# Patient Record
Sex: Male | Born: 1985
Health system: Southern US, Community
[De-identification: ages and names within clinical notes are randomized; demographics above are authoritative.]

## PROBLEM LIST (undated history)

## (undated) DIAGNOSIS — Y249XXA Unspecified firearm discharge, undetermined intent, initial encounter: Secondary | ICD-10-CM

## (undated) DIAGNOSIS — W3400XA Accidental discharge from unspecified firearms or gun, initial encounter: Secondary | ICD-10-CM

## (undated) HISTORY — DX: Accidental discharge from unspecified firearms or gun, initial encounter: W34.00XA

## (undated) HISTORY — PX: OMENTECTOMY: SHX2098

## (undated) HISTORY — PX: GASTRORRHAPHY: SHX6263

## (undated) HISTORY — PX: HEPATORRHAPHY: SHX6320

## (undated) HISTORY — PX: SMALL BOWEL REPAIR: SHX6447

## (undated) HISTORY — PX: COLON SURGERY: SHX602

## (undated) HISTORY — PX: BLADDER REPAIR: SHX76

## (undated) HISTORY — DX: Unspecified firearm discharge, undetermined intent, initial encounter: Y24.9XXA

## (undated) HISTORY — PX: RESECTION SMALL BOWEL / CLOSURE ILEOSTOMY: SUR1248

---

## 2017-02-07 ENCOUNTER — Emergency Department (HOSPITAL_COMMUNITY)
Admission: EM | Admit: 2017-02-07 | Discharge: 2017-02-07 | Disposition: A | Discharge status: Home / Self Care | Payer: Self-pay | Attending: Emergency Medicine | Admitting: Emergency Medicine

## 2017-02-07 ENCOUNTER — Encounter (HOSPITAL_COMMUNITY): Payer: Self-pay | Admitting: Emergency Medicine

## 2017-02-07 DIAGNOSIS — K0889 Other specified disorders of teeth and supporting structures: Secondary | ICD-10-CM | POA: Insufficient documentation

## 2017-02-07 DIAGNOSIS — F17200 Nicotine dependence, unspecified, uncomplicated: Secondary | ICD-10-CM | POA: Insufficient documentation

## 2017-02-07 MED ORDER — PENICILLIN V POTASSIUM 500 MG PO TABS: 500.0000 mg | ORAL_TABLET | Freq: Four times a day (QID) | ORAL | 0 refills | Status: AC

## 2017-02-07 MED ORDER — IBUPROFEN 200 MG PO TABS
600.0000 mg | ORAL_TABLET | Freq: Once | ORAL | Status: AC
  Administered 2017-02-07: 600 mg via ORAL
  Filled 2017-02-07: qty 3

## 2017-02-07 MED ORDER — IBUPROFEN 600 MG PO TABS: 600.0000 mg | ORAL_TABLET | Freq: Four times a day (QID) | ORAL | 0 refills | Status: DC | PRN

## 2017-02-07 MED ORDER — HYDROCODONE-ACETAMINOPHEN 5-325 MG PO TABS: 2.0000 | ORAL_TABLET | ORAL | 0 refills | Status: DC | PRN

## 2017-02-07 MED ORDER — OXYCODONE-ACETAMINOPHEN 5-325 MG PO TABS
1.0000 | ORAL_TABLET | Freq: Once | ORAL | Status: AC
  Administered 2017-02-07: 1 via ORAL
  Filled 2017-02-07: qty 1

## 2017-02-07 NOTE — ED Notes (Signed)
Pt reports R lower dental pain x 1 week.  Broken tooth noted.

## 2017-02-07 NOTE — ED Provider Notes (Signed)
[facility]-EMERGENCY DEPT Provider Note   CSN: [id number] Arrival date & time: 02/07/17  1835  By signing my name below, I, Rosario Adie, attest that this documentation has been prepared under the direction and in the presence of Raeford Razor, MD. Electronically Signed: Rosario Adie, ED Scribe. 02/07/17. 7:47 PM.  History   Chief Complaint Chief Complaint  Patient presents with  . Dental Pain   The history is provided by the patient. No language interpreter was used.    HPI Comments: John Briggs is a 31 y.o. male with no pertinent PMHx, who presents to the Emergency Department complaining of persistent, gradually worsening, right-sided, lower dental pain beginning approximately one week ago. Pt describes their pain as throbbing. Per pt, a tooth to the area chipped several months ago and had not given him issues until one week ago. He reports associated right ear pain and right-sided cervical lymphadenopathy. Pt has been taking Ibuprogen at home with minimal relief of his pain. Pt's pain is exacerbated with chewing. They are not currently followed by a dental specialist. Pt denies fever, chills, difficulty swallowing, or any other associated symptoms.   History reviewed. No pertinent past medical history.  There are no active problems to display for this patient.  History reviewed. No pertinent surgical history.  Home Medications    Prior to Admission medications   Not on File   Family History Family History  Problem Relation Age of Onset  . Cancer Other    Social History Social History  Substance Use Topics  . Smoking status: Current Every Day Smoker  . Smokeless tobacco: Never Used  . Alcohol use Yes     Comment: weekends   Allergies   Patient has no known allergies.  Review of Systems Review of Systems  Constitutional: Negative for chills and fever.  HENT: Positive for dental problem and ear pain. Negative for trouble swallowing.   Hematological:  Positive for adenopathy.  All other systems reviewed and are negative.  Physical Exam Updated Vital Signs BP (!) 145/93 (BP Location: Left Arm)   Pulse 85   Temp 98.5 F (36.9 C) (Oral)   Resp 18   SpO2 99%   Physical Exam  Constitutional: He appears well-developed and well-nourished. No distress.  HENT:  Head: Normocephalic and atraumatic.  Right lower premolar cracked. Some mild gingival swelling, but no abscess. No trismus. Handling secretions. Mild right facial swelling. Submental tissues soft.   Eyes: Conjunctivae are normal.  Neck: Normal range of motion. Neck supple.  Neck supple. Right cervical adenopathy.   Cardiovascular: Normal rate.   Pulmonary/Chest: Effort normal.  Abdominal: He exhibits no distension. Mass:  .scrib.  Musculoskeletal: Normal range of motion.  Lymphadenopathy:    He has cervical adenopathy.  Neurological: He is alert.  Skin: No pallor.  Psychiatric: He has a normal mood and affect. His behavior is normal.  Nursing note and vitals reviewed.  ED Treatments / Results  DIAGNOSTIC STUDIES: Oxygen Saturation is 99% on RA, normal by my interpretation.   COORDINATION OF CARE: 7:45 PM-Discussed next steps with pt. Pt verbalized understanding and is agreeable with the plan.   Labs (all labs ordered are listed, but only abnormal results are displayed) Labs Reviewed - No data to display  EKG  EKG Interpretation None      Radiology No results found.  Procedures Procedures   Medications Ordered in ED Medications - No data to display  Initial Impression / Assessment and Plan / ED Course  I  have reviewed the triage vital signs and the nursing notes.  Pertinent labs & imaging results that were available during my care of the patient were reviewed by me and considered in my medical decision making (see chart for details).     31 year old male with dental/facial pain. Some mild right-sided facial swelling on exam. No significant concern  for airway compromise. No drainable collection. Will place on antibiotics. When necessary pain medication. Needs dental follow-up.  Final Clinical Impressions(s) / ED Diagnoses   Final diagnoses:  Toothache   New Prescriptions New Prescriptions   No medications on file   I personally preformed the services scribed in my presence. The recorded information has been reviewed is accurate. Raeford RazorStephen Bradely Rudin, MD.     Raeford RazorKohut, Kathie Posa, MD 02/07/17 [phone]

## 2017-02-07 NOTE — ED Triage Notes (Signed)
Pt is c/o toothache on the right side  Pt has facial swelling noted  Sxs started a week ago

## 2017-02-07 NOTE — ED Notes (Signed)
Pt reports he called for a ride.  Verbalizes understanding of not driving after pain med administration

## 2019-01-23 ENCOUNTER — Emergency Department (HOSPITAL_COMMUNITY): Payer: No Typology Code available for payment source

## 2019-01-23 ENCOUNTER — Encounter (HOSPITAL_COMMUNITY): Admission: EM | Disposition: A | Discharge status: Home / Self Care | Payer: Self-pay | Source: Home / Self Care

## 2019-01-23 ENCOUNTER — Encounter (HOSPITAL_COMMUNITY): Payer: Self-pay | Admitting: Emergency Medicine

## 2019-01-23 ENCOUNTER — Emergency Department (HOSPITAL_COMMUNITY): Payer: No Typology Code available for payment source | Admitting: Anesthesiology

## 2019-01-23 ENCOUNTER — Other Ambulatory Visit: Payer: Self-pay

## 2019-01-23 ENCOUNTER — Inpatient Hospital Stay (HOSPITAL_COMMUNITY)
Admission: EM | Admit: 2019-01-23 | Discharge: 2019-02-11 | DRG: 957 | Disposition: A | Discharge status: Home / Self Care | Payer: No Typology Code available for payment source | Attending: General Surgery | Admitting: General Surgery

## 2019-01-23 DIAGNOSIS — S3720XD Unspecified injury of bladder, subsequent encounter: Secondary | ICD-10-CM

## 2019-01-23 DIAGNOSIS — R0602 Shortness of breath: Secondary | ICD-10-CM

## 2019-01-23 DIAGNOSIS — T148XXA Other injury of unspecified body region, initial encounter: Secondary | ICD-10-CM | POA: Diagnosis not present

## 2019-01-23 DIAGNOSIS — L0291 Cutaneous abscess, unspecified: Secondary | ICD-10-CM

## 2019-01-23 DIAGNOSIS — S31639A Puncture wound without foreign body of abdominal wall, unspecified quadrant with penetration into peritoneal cavity, initial encounter: Secondary | ICD-10-CM | POA: Diagnosis present

## 2019-01-23 DIAGNOSIS — S3681XA Injury of peritoneum, initial encounter: Secondary | ICD-10-CM | POA: Diagnosis present

## 2019-01-23 DIAGNOSIS — S36118A Other injury of liver, initial encounter: Secondary | ICD-10-CM | POA: Diagnosis not present

## 2019-01-23 DIAGNOSIS — S3639XA Other injury of stomach, initial encounter: Secondary | ICD-10-CM | POA: Diagnosis present

## 2019-01-23 DIAGNOSIS — S3729XA Other injury of bladder, initial encounter: Secondary | ICD-10-CM | POA: Diagnosis present

## 2019-01-23 DIAGNOSIS — K651 Peritoneal abscess: Secondary | ICD-10-CM

## 2019-01-23 DIAGNOSIS — R402362 Coma scale, best motor response, obeys commands, at arrival to emergency department: Secondary | ICD-10-CM | POA: Diagnosis present

## 2019-01-23 DIAGNOSIS — W3400XA Accidental discharge from unspecified firearms or gun, initial encounter: Secondary | ICD-10-CM

## 2019-01-23 DIAGNOSIS — T07XXXA Unspecified multiple injuries, initial encounter: Secondary | ICD-10-CM

## 2019-01-23 DIAGNOSIS — S31632A Puncture wound without foreign body of abdominal wall, epigastric region with penetration into peritoneal cavity, initial encounter: Secondary | ICD-10-CM | POA: Diagnosis present

## 2019-01-23 DIAGNOSIS — R571 Hypovolemic shock: Secondary | ICD-10-CM

## 2019-01-23 DIAGNOSIS — Z978 Presence of other specified devices: Secondary | ICD-10-CM

## 2019-01-23 DIAGNOSIS — R578 Other shock: Secondary | ICD-10-CM | POA: Diagnosis present

## 2019-01-23 DIAGNOSIS — R188 Other ascites: Secondary | ICD-10-CM

## 2019-01-23 DIAGNOSIS — Z20828 Contact with and (suspected) exposure to other viral communicable diseases: Secondary | ICD-10-CM | POA: Diagnosis present

## 2019-01-23 DIAGNOSIS — S36498A Other injury of other part of small intestine, initial encounter: Secondary | ICD-10-CM | POA: Diagnosis present

## 2019-01-23 DIAGNOSIS — R402252 Coma scale, best verbal response, oriented, at arrival to emergency department: Secondary | ICD-10-CM | POA: Diagnosis present

## 2019-01-23 DIAGNOSIS — S3720XA Unspecified injury of bladder, initial encounter: Secondary | ICD-10-CM

## 2019-01-23 DIAGNOSIS — D62 Acute posthemorrhagic anemia: Secondary | ICD-10-CM | POA: Diagnosis present

## 2019-01-23 DIAGNOSIS — R402142 Coma scale, eyes open, spontaneous, at arrival to emergency department: Secondary | ICD-10-CM | POA: Diagnosis present

## 2019-01-23 DIAGNOSIS — E876 Hypokalemia: Secondary | ICD-10-CM | POA: Diagnosis not present

## 2019-01-23 HISTORY — PX: LAPAROTOMY: SHX154

## 2019-01-23 LAB — PROTIME-INR
INR: 1.2 (ref 0.8–1.2)
Prothrombin Time: 15 seconds (ref 11.4–15.2)

## 2019-01-23 LAB — LACTIC ACID, PLASMA: Lactic Acid, Venous: >11 mmol/L (ref 0.5–1.9)

## 2019-01-23 LAB — CBC
HCT: 33.4 % — ABNORMAL LOW (ref 39.0–52.0)
Hemoglobin: 10.8 g/dL — ABNORMAL LOW (ref 13.0–17.0)
MCH: 34.3 pg — ABNORMAL HIGH (ref 26.0–34.0)
MCHC: 32.3 g/dL (ref 30.0–36.0)
MCV: 106 fL — ABNORMAL HIGH (ref 80.0–100.0)
Platelets: 250 10*3/uL (ref 150–400)
RBC: 3.15 MIL/uL — ABNORMAL LOW (ref 4.22–5.81)
RDW: 13.2 % (ref 11.5–15.5)
WBC: 9.5 10*3/uL (ref 4.0–10.5)
nRBC: 0.4 % — ABNORMAL HIGH (ref 0.0–0.2)

## 2019-01-23 LAB — ABO/RH: ABO/RH(D): O NEG

## 2019-01-23 LAB — ETHANOL: Alcohol, Ethyl (B): 326 mg/dL (ref <10)

## 2019-01-23 SURGERY — LAPAROTOMY, EXPLORATORY
Anesthesia: General

## 2019-01-23 MED ORDER — SODIUM CHLORIDE 0.9% IV SOLUTION: Freq: Once | INTRAVENOUS | Status: DC

## 2019-01-23 MED ORDER — MIDAZOLAM HCL 5 MG/5ML IJ SOLN
INTRAMUSCULAR | Status: DC | PRN
  Administered 2019-01-23: 2 mg via INTRAVENOUS

## 2019-01-23 MED ORDER — CALCIUM CHLORIDE 10 % IV SOLN
INTRAVENOUS | Status: DC | PRN
  Administered 2019-01-23: 700 mg via INTRAVENOUS
  Administered 2019-01-23: 300 mg via INTRAVENOUS

## 2019-01-23 MED ORDER — TRANEXAMIC ACID-NACL 1000-0.7 MG/100ML-% IV SOLN
1000.0000 mg | Freq: Once | INTRAVENOUS | Status: AC
  Administered 2019-01-23: 23:00:00 1000 mg via INTRAVENOUS
  Filled 2019-01-23: qty 100

## 2019-01-23 MED ORDER — FENTANYL CITRATE (PF) 250 MCG/5ML IJ SOLN
INTRAMUSCULAR | Status: AC
  Filled 2019-01-23: qty 5

## 2019-01-23 MED ORDER — SODIUM BICARBONATE 8.4 % IV SOLN
INTRAVENOUS | Status: DC | PRN
  Administered 2019-01-23 (×2): 50 meq via INTRAVENOUS

## 2019-01-23 MED ORDER — 0.9 % SODIUM CHLORIDE (POUR BTL) OPTIME
TOPICAL | Status: DC | PRN
  Administered 2019-01-23 – 2019-01-24 (×3): 1000 mL

## 2019-01-23 MED ORDER — SODIUM CHLORIDE 0.9 % IV SOLN
INTRAVENOUS | Status: AC | PRN
  Administered 2019-01-23: 1000 mL via INTRAVENOUS

## 2019-01-23 MED ORDER — PHENYLEPHRINE HCL (PRESSORS) 10 MG/ML IV SOLN
INTRAVENOUS | Status: DC | PRN
  Administered 2019-01-23: 80 ug via INTRAVENOUS

## 2019-01-23 MED ORDER — MIDAZOLAM HCL 2 MG/2ML IJ SOLN
INTRAMUSCULAR | Status: AC
  Filled 2019-01-23: qty 2

## 2019-01-23 MED ORDER — CALCIUM CHLORIDE 10 % IV SOLN
INTRAVENOUS | Status: AC
  Filled 2019-01-23: qty 20

## 2019-01-23 MED ORDER — LACTATED RINGERS IV SOLN
INTRAVENOUS | Status: DC | PRN
  Administered 2019-01-23: 23:00:00 via INTRAVENOUS

## 2019-01-23 MED ORDER — TRANEXAMIC ACID 1000 MG/10ML IV SOLN
1000.0000 mg | Freq: Once | INTRAVENOUS | Status: AC
  Administered 2019-01-23: 1000 mg via INTRAVENOUS
  Filled 2019-01-23: qty 10

## 2019-01-23 MED ORDER — SODIUM CHLORIDE 0.9 % IV SOLN
INTRAVENOUS | Status: DC | PRN
  Administered 2019-01-23 – 2019-01-24 (×2): via INTRAVENOUS

## 2019-01-23 MED ORDER — CEFAZOLIN SODIUM-DEXTROSE 2-3 GM-%(50ML) IV SOLR
INTRAVENOUS | Status: DC | PRN
  Administered 2019-01-23: 2 g via INTRAVENOUS

## 2019-01-23 SURGICAL SUPPLY — 62 items
BLADE CLIPPER SURG (BLADE) IMPLANT
CANISTER SUCT 3000ML PPV (MISCELLANEOUS) ×3 IMPLANT
CANISTER WOUNDNEG PRESSURE 500 (CANNISTER) ×3 IMPLANT
CATH FOLEY 3WAY 30CC 22FR (CATHETERS) ×3 IMPLANT
CHLORAPREP W/TINT 26ML (MISCELLANEOUS) ×3 IMPLANT
COVER SURGICAL LIGHT HANDLE (MISCELLANEOUS) ×3 IMPLANT
COVER WAND RF STERILE (DRAPES) ×3 IMPLANT
DRAPE LAPAROSCOPIC ABDOMINAL (DRAPES) ×3 IMPLANT
DRAPE WARM FLUID 44X44 (DRAPE) ×3 IMPLANT
DRSG OPSITE POSTOP 4X10 (GAUZE/BANDAGES/DRESSINGS) IMPLANT
DRSG OPSITE POSTOP 4X8 (GAUZE/BANDAGES/DRESSINGS) IMPLANT
ELECT BLADE 6.5 EXT (BLADE) IMPLANT
ELECT CAUTERY BLADE 6.4 (BLADE) ×3 IMPLANT
ELECT REM PT RETURN 9FT ADLT (ELECTROSURGICAL) ×3
ELECTRODE REM PT RTRN 9FT ADLT (ELECTROSURGICAL) ×1 IMPLANT
GAUZE SPONGE 4X4 12PLY STRL (GAUZE/BANDAGES/DRESSINGS) ×3 IMPLANT
GLOVE BIO SURGEON STRL SZ8 (GLOVE) ×3 IMPLANT
GLOVE BIOGEL PI IND STRL 8 (GLOVE) ×1 IMPLANT
GLOVE BIOGEL PI INDICATOR 8 (GLOVE) ×2
GOWN STRL REUS W/ TWL LRG LVL3 (GOWN DISPOSABLE) ×1 IMPLANT
GOWN STRL REUS W/ TWL XL LVL3 (GOWN DISPOSABLE) ×1 IMPLANT
GOWN STRL REUS W/TWL LRG LVL3 (GOWN DISPOSABLE) ×2
GOWN STRL REUS W/TWL XL LVL3 (GOWN DISPOSABLE) ×2
GUIDEWIRE ANG ZIPWIRE 038X150 (WIRE) ×3 IMPLANT
GUIDEWIRE STR DUAL SENSOR (WIRE) ×3 IMPLANT
HEMOSTAT SNOW SURGICEL 2X4 (HEMOSTASIS) ×2 IMPLANT
KIT BASIN OR (CUSTOM PROCEDURE TRAY) ×3 IMPLANT
KIT TURNOVER KIT B (KITS) ×3 IMPLANT
LIGASURE IMPACT 36 18CM CVD LR (INSTRUMENTS) ×6 IMPLANT
NS IRRIG 1000ML POUR BTL (IV SOLUTION) ×6 IMPLANT
PACK GENERAL/GYN (CUSTOM PROCEDURE TRAY) ×3 IMPLANT
PAD ARMBOARD 7.5X6 YLW CONV (MISCELLANEOUS) ×3 IMPLANT
PENCIL SMOKE EVACUATOR (MISCELLANEOUS) ×3 IMPLANT
PLUG CATH AND CAP STER (CATHETERS) ×6 IMPLANT
RELOAD LINEAR CUT PROX 55 BLUE (ENDOMECHANICALS) ×6 IMPLANT
RELOAD PROXIMATE 75MM BLUE (ENDOMECHANICALS) ×21 IMPLANT
RELOAD PROXIMATE TA60MM BLUE (ENDOMECHANICALS) ×9 IMPLANT
SPECIMEN JAR LARGE (MISCELLANEOUS) IMPLANT
SPONGE ABD ABTHERA ADVANCE (MISCELLANEOUS) ×3 IMPLANT
SPONGE ABDOMINAL VAC ABTHERA (MISCELLANEOUS) ×3 IMPLANT
SPONGE LAP 18X18 RF (DISPOSABLE) ×3 IMPLANT
STAPLER GUN LINEAR PROX 60 (STAPLE) ×3 IMPLANT
STAPLER PROXIMATE 75MM BLUE (STAPLE) ×3 IMPLANT
STAPLER VISISTAT 35W (STAPLE) ×3 IMPLANT
STENT URET 6FRX26 CONTOUR (STENTS) ×3 IMPLANT
SUCTION POOLE TIP (SUCTIONS) ×3 IMPLANT
SURGICEL SNOW 2X4 (HEMOSTASIS) ×3 IMPLANT
SUT PDS AB 1 TP1 96 (SUTURE) ×6 IMPLANT
SUT SILK 2 0 SH CR/8 (SUTURE) ×3 IMPLANT
SUT SILK 2 0 TIES 10X30 (SUTURE) ×6 IMPLANT
SUT SILK 2 0SH CR/8 30 (SUTURE) ×3 IMPLANT
SUT SILK 3 0 SH CR/8 (SUTURE) ×6 IMPLANT
SUT SILK 3 0 TIES 10X30 (SUTURE) ×6 IMPLANT
SUT SILK 3 0SH CR/8 30 (SUTURE) ×3 IMPLANT
SUT VIC AB 0 CT1 27 (SUTURE) ×2
SUT VIC AB 0 CT1 27XBRD ANBCTR (SUTURE) ×1 IMPLANT
SUT VIC AB 2-0 SH 27 (SUTURE) ×2
SUT VIC AB 2-0 SH 27X BRD (SUTURE) ×1 IMPLANT
TOWEL OR 17X26 10 PK STRL BLUE (TOWEL DISPOSABLE) ×3 IMPLANT
TRAY FOLEY MTR SLVR 16FR STAT (SET/KITS/TRAYS/PACK) IMPLANT
TUBE FEEDING ENTERAL 5FR 16IN (TUBING) ×3 IMPLANT
YANKAUER SUCT BULB TIP NO VENT (SUCTIONS) IMPLANT

## 2019-01-23 NOTE — Anesthesia Preprocedure Evaluation (Addendum)
Anesthesia Evaluation  Patient identified by MRN, date of birth, ID band Patient awake    Reviewed: Allergy & Precautions, NPO status , Patient's Chart, lab work & pertinent test resultsPreop documentation limited or incomplete due to emergent nature of procedure.  Airway Mallampati: II  TM Distance: >3 FB     Dental  (+) Dental Advisory Given   Pulmonary neg pulmonary ROS,    breath sounds clear to auscultation       Cardiovascular  Rhythm:Regular Rate:Normal     Neuro/Psych    GI/Hepatic   Endo/Other    Renal/GU      Musculoskeletal   Abdominal   Peds  Hematology   Anesthesia Other Findings   Reproductive/Obstetrics                            Anesthesia Physical Anesthesia Plan  ASA: I and emergent  Anesthesia Plan: General   Post-op Pain Management:    Induction: Intravenous  PONV Risk Score and Plan: 2 and Ondansetron and Dexamethasone  Airway Management Planned: Oral ETT  Additional Equipment:   Intra-op Plan:   Post-operative Plan: Possible Post-op intubation/ventilation  Informed Consent: I have reviewed the patients History and Physical, chart, labs and discussed the procedure including the risks, benefits and alternatives for the proposed anesthesia with the patient or authorized representative who has indicated his/her understanding and acceptance.     Dental advisory given  Plan Discussed with: CRNA, Anesthesiologist and Surgeon  Anesthesia Plan Comments:        Anesthesia Quick Evaluation
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
## 2019-01-24 ENCOUNTER — Encounter (HOSPITAL_COMMUNITY): Payer: Self-pay | Admitting: General Surgery

## 2019-01-24 DIAGNOSIS — S3681XA Injury of peritoneum, initial encounter: Secondary | ICD-10-CM | POA: Diagnosis present

## 2019-01-24 DIAGNOSIS — R578 Other shock: Secondary | ICD-10-CM | POA: Diagnosis present

## 2019-01-24 DIAGNOSIS — S3639XA Other injury of stomach, initial encounter: Secondary | ICD-10-CM | POA: Diagnosis present

## 2019-01-24 DIAGNOSIS — E876 Hypokalemia: Secondary | ICD-10-CM | POA: Diagnosis not present

## 2019-01-24 DIAGNOSIS — S31632A Puncture wound without foreign body of abdominal wall, epigastric region with penetration into peritoneal cavity, initial encounter: Secondary | ICD-10-CM | POA: Diagnosis present

## 2019-01-24 DIAGNOSIS — R402142 Coma scale, eyes open, spontaneous, at arrival to emergency department: Secondary | ICD-10-CM | POA: Diagnosis present

## 2019-01-24 DIAGNOSIS — W3400XA Accidental discharge from unspecified firearms or gun, initial encounter: Secondary | ICD-10-CM | POA: Diagnosis not present

## 2019-01-24 DIAGNOSIS — D62 Acute posthemorrhagic anemia: Secondary | ICD-10-CM | POA: Diagnosis present

## 2019-01-24 DIAGNOSIS — R402362 Coma scale, best motor response, obeys commands, at arrival to emergency department: Secondary | ICD-10-CM | POA: Diagnosis present

## 2019-01-24 DIAGNOSIS — S3729XA Other injury of bladder, initial encounter: Secondary | ICD-10-CM | POA: Diagnosis present

## 2019-01-24 DIAGNOSIS — T148XXA Other injury of unspecified body region, initial encounter: Secondary | ICD-10-CM | POA: Diagnosis present

## 2019-01-24 DIAGNOSIS — S36118A Other injury of liver, initial encounter: Secondary | ICD-10-CM | POA: Diagnosis present

## 2019-01-24 DIAGNOSIS — K651 Peritoneal abscess: Secondary | ICD-10-CM | POA: Diagnosis not present

## 2019-01-24 DIAGNOSIS — S31639A Puncture wound without foreign body of abdominal wall, unspecified quadrant with penetration into peritoneal cavity, initial encounter: Secondary | ICD-10-CM | POA: Diagnosis present

## 2019-01-24 DIAGNOSIS — S36498A Other injury of other part of small intestine, initial encounter: Secondary | ICD-10-CM | POA: Diagnosis present

## 2019-01-24 DIAGNOSIS — R402252 Coma scale, best verbal response, oriented, at arrival to emergency department: Secondary | ICD-10-CM | POA: Diagnosis present

## 2019-01-24 DIAGNOSIS — Z20828 Contact with and (suspected) exposure to other viral communicable diseases: Secondary | ICD-10-CM | POA: Diagnosis present

## 2019-01-24 LAB — PREPARE PLATELET PHERESIS: Unit division: 0

## 2019-01-24 LAB — PREPARE FRESH FROZEN PLASMA
Unit division: 0
Unit division: 0
Unit division: 0
Unit division: 0
Unit division: 0
Unit division: 0
Unit division: 0
Unit division: 0

## 2019-01-24 LAB — POCT I-STAT 7, (LYTES, BLD GAS, ICA,H+H)
Acid-base deficit: 12 mmol/L — ABNORMAL HIGH (ref 0.0–2.0)
Acid-base deficit: 3 mmol/L — ABNORMAL HIGH (ref 0.0–2.0)
Acid-base deficit: 4 mmol/L — ABNORMAL HIGH (ref 0.0–2.0)
Acid-base deficit: 6 mmol/L — ABNORMAL HIGH (ref 0.0–2.0)
Bicarbonate: 14.8 mmol/L — ABNORMAL LOW (ref 20.0–28.0)
Bicarbonate: 20.9 mmol/L (ref 20.0–28.0)
Bicarbonate: 21.3 mmol/L (ref 20.0–28.0)
Bicarbonate: 18.3 mmol/L — ABNORMAL LOW (ref 20.0–28.0)
Calcium, Ion: 0.72 mmol/L — CL (ref 1.15–1.40)
Calcium, Ion: 0.96 mmol/L — ABNORMAL LOW (ref 1.15–1.40)
Calcium, Ion: 1.04 mmol/L — ABNORMAL LOW (ref 1.15–1.40)
Calcium, Ion: 1.07 mmol/L — ABNORMAL LOW (ref 1.15–1.40)
HCT: 25 % — ABNORMAL LOW (ref 39.0–52.0)
HCT: 27 % — ABNORMAL LOW (ref 39.0–52.0)
HCT: 29 % — ABNORMAL LOW (ref 39.0–52.0)
HCT: 37 % — ABNORMAL LOW (ref 39.0–52.0)
Hemoglobin: 8.5 g/dL — ABNORMAL LOW (ref 13.0–17.0)
Hemoglobin: 9.2 g/dL — ABNORMAL LOW (ref 13.0–17.0)
Hemoglobin: 9.9 g/dL — ABNORMAL LOW (ref 13.0–17.0)
Hemoglobin: 12.6 g/dL — ABNORMAL LOW (ref 13.0–17.0)
O2 Saturation: 100 %
O2 Saturation: 100 %
O2 Saturation: 100 %
O2 Saturation: 100 %
Patient temperature: 34
Patient temperature: 34
Patient temperature: 34.8
Patient temperature: 93.6
Potassium: 2.9 mmol/L — ABNORMAL LOW (ref 3.5–5.1)
Potassium: 3.1 mmol/L — ABNORMAL LOW (ref 3.5–5.1)
Potassium: 3.8 mmol/L (ref 3.5–5.1)
Potassium: 2.9 mmol/L — ABNORMAL LOW (ref 3.5–5.1)
Sodium: 143 mmol/L (ref 135–145)
Sodium: 144 mmol/L (ref 135–145)
Sodium: 145 mmol/L (ref 135–145)
Sodium: 144 mmol/L (ref 135–145)
TCO2: 16 mmol/L — ABNORMAL LOW (ref 22–32)
TCO2: 22 mmol/L (ref 22–32)
TCO2: 22 mmol/L (ref 22–32)
TCO2: 19 mmol/L — ABNORMAL LOW (ref 22–32)
pCO2 arterial: 30.5 mmHg — ABNORMAL LOW (ref 32.0–48.0)
pCO2 arterial: 31 mmHg — ABNORMAL LOW (ref 32.0–48.0)
pCO2 arterial: 34.3 mmHg (ref 32.0–48.0)
pCO2 arterial: 29.1 mmHg — ABNORMAL LOW (ref 32.0–48.0)
pH, Arterial: 7.229 — ABNORMAL LOW (ref 7.350–7.450)
pH, Arterial: 7.431 (ref 7.350–7.450)
pH, Arterial: 7.432 (ref 7.350–7.450)
pH, Arterial: 7.394 (ref 7.350–7.450)
pO2, Arterial: 537 mmHg — ABNORMAL HIGH (ref 83.0–108.0)
pO2, Arterial: 550 mmHg — ABNORMAL HIGH (ref 83.0–108.0)
pO2, Arterial: 575 mmHg — ABNORMAL HIGH (ref 83.0–108.0)
pO2, Arterial: 316 mmHg — ABNORMAL HIGH (ref 83.0–108.0)

## 2019-01-24 LAB — BPAM FFP
Blood Product Expiration Date: 202005012359
Blood Product Expiration Date: 202005032359
Blood Product Expiration Date: 202005032359
Blood Product Expiration Date: 202005032359
Blood Product Expiration Date: 202005092359
Blood Product Expiration Date: 202005172359
Blood Product Expiration Date: 202005182359
Blood Product Expiration Date: 202005182359
Blood Product Expiration Date: 202005212359
Blood Product Expiration Date: 202005222359
ISSUE DATE / TIME: 202004302238
ISSUE DATE / TIME: 202004302238
ISSUE DATE / TIME: 202004302316
ISSUE DATE / TIME: 202004302316
ISSUE DATE / TIME: 202004302340
ISSUE DATE / TIME: 202004302350
ISSUE DATE / TIME: 202004302350
ISSUE DATE / TIME: 202004302350
ISSUE DATE / TIME: 202004302350
ISSUE DATE / TIME: 202005010845
Unit Type and Rh: 600
Unit Type and Rh: 6200
Unit Type and Rh: 6200
Unit Type and Rh: 6200
Unit Type and Rh: 6200
Unit Type and Rh: 6200
Unit Type and Rh: 6200
Unit Type and Rh: 6200
Unit Type and Rh: 6200
Unit Type and Rh: 6200

## 2019-01-24 LAB — COMPREHENSIVE METABOLIC PANEL
ALT: 27 U/L (ref 0–44)
AST: 55 U/L — ABNORMAL HIGH (ref 15–41)
Albumin: 3.3 g/dL — ABNORMAL LOW (ref 3.5–5.0)
Alkaline Phosphatase: 54 U/L (ref 38–126)
Anion gap: 22 — ABNORMAL HIGH (ref 5–15)
BUN: 17 mg/dL (ref 6–20)
CO2: 12 mmol/L — ABNORMAL LOW (ref 22–32)
Calcium: 7.9 mg/dL — ABNORMAL LOW (ref 8.9–10.3)
Chloride: 106 mmol/L (ref 98–111)
Creatinine, Ser: 1.4 mg/dL — ABNORMAL HIGH (ref 0.61–1.24)
GFR calc Af Amer: >60 mL/min (ref >60)
GFR calc non Af Amer: >60 mL/min (ref >60)
Glucose, Bld: 158 mg/dL — ABNORMAL HIGH (ref 70–99)
Potassium: 2.9 mmol/L — ABNORMAL LOW (ref 3.5–5.1)
Sodium: 140 mmol/L (ref 135–145)
Total Bilirubin: 0.4 mg/dL (ref 0.3–1.2)
Total Protein: 6.4 g/dL — ABNORMAL LOW (ref 6.5–8.1)

## 2019-01-24 LAB — BASIC METABOLIC PANEL
Anion gap: 17 — ABNORMAL HIGH (ref 5–15)
BUN: 14 mg/dL (ref 6–20)
CO2: 16 mmol/L — ABNORMAL LOW (ref 22–32)
Calcium: 7.4 mg/dL — ABNORMAL LOW (ref 8.9–10.3)
Chloride: 109 mmol/L (ref 98–111)
Creatinine, Ser: 1.37 mg/dL — ABNORMAL HIGH (ref 0.61–1.24)
GFR calc Af Amer: >60 mL/min (ref >60)
GFR calc non Af Amer: >60 mL/min (ref >60)
Glucose, Bld: 89 mg/dL (ref 70–99)
Potassium: 3 mmol/L — ABNORMAL LOW (ref 3.5–5.1)
Sodium: 142 mmol/L (ref 135–145)

## 2019-01-24 LAB — URINALYSIS, MICROSCOPIC (REFLEX): RBC / HPF: >50 RBC/hpf (ref 0–5)

## 2019-01-24 LAB — BPAM PLATELET PHERESIS
Blood Product Expiration Date: 202005012359
ISSUE DATE / TIME: 202004302343
Unit Type and Rh: 7300

## 2019-01-24 LAB — URINALYSIS, ROUTINE W REFLEX MICROSCOPIC

## 2019-01-24 LAB — BPAM CRYOPRECIPITATE
Blood Product Expiration Date: 202005010555
Unit Type and Rh: 5100

## 2019-01-24 LAB — BLOOD PRODUCT ORDER (VERBAL) VERIFICATION

## 2019-01-24 LAB — PREPARE CRYOPRECIPITATE: Unit division: 0

## 2019-01-24 LAB — PROTIME-INR
INR: 1.3 — ABNORMAL HIGH (ref 0.8–1.2)
Prothrombin Time: 16.5 seconds — ABNORMAL HIGH (ref 11.4–15.2)

## 2019-01-24 LAB — CDS SEROLOGY

## 2019-01-24 LAB — TRIGLYCERIDES: Triglycerides: 398 mg/dL — ABNORMAL HIGH (ref <150)

## 2019-01-24 LAB — HIV ANTIBODY (ROUTINE TESTING W REFLEX): HIV Screen 4th Generation wRfx: NONREACTIVE

## 2019-01-24 LAB — MRSA PCR SCREENING: MRSA by PCR: NEGATIVE

## 2019-01-24 MED ORDER — POTASSIUM CHLORIDE 10 MEQ/100ML IV SOLN
10.0000 meq | INTRAVENOUS | Status: AC
  Administered 2019-01-24 (×4): 10 meq via INTRAVENOUS
  Filled 2019-01-24 (×5): qty 100

## 2019-01-24 MED ORDER — KCL IN DEXTROSE-NACL 20-5-0.45 MEQ/L-%-% IV SOLN
INTRAVENOUS | Status: DC
  Administered 2019-01-24 – 2019-02-03 (×16): via INTRAVENOUS
  Filled 2019-01-24 (×19): qty 1000

## 2019-01-24 MED ORDER — FENTANYL CITRATE (PF) 100 MCG/2ML IJ SOLN: 50.0000 ug | Freq: Once | INTRAMUSCULAR | Status: DC

## 2019-01-24 MED ORDER — PANTOPRAZOLE SODIUM 40 MG IV SOLR
40.0000 mg | Freq: Every day | INTRAVENOUS | Status: DC
  Administered 2019-01-24 – 2019-01-26 (×3): 40 mg via INTRAVENOUS
  Filled 2019-01-24 (×4): qty 40

## 2019-01-24 MED ORDER — ONDANSETRON 4 MG PO TBDP
4.0000 mg | ORAL_TABLET | Freq: Four times a day (QID) | ORAL | Status: DC | PRN
  Filled 2019-01-24: qty 1

## 2019-01-24 MED ORDER — DEXMEDETOMIDINE HCL IN NACL 200 MCG/50ML IV SOLN
0.4000 ug/kg/h | INTRAVENOUS | Status: DC
  Administered 2019-01-24: 1.2 ug/kg/h via INTRAVENOUS
  Administered 2019-01-24: 0.4 ug/kg/h via INTRAVENOUS
  Administered 2019-01-24 – 2019-01-27 (×19): 1.2 ug/kg/h via INTRAVENOUS
  Administered 2019-01-27: 1 ug/kg/h via INTRAVENOUS
  Administered 2019-01-27: 1.2 ug/kg/h via INTRAVENOUS
  Administered 2019-01-27: 0.8 ug/kg/h via INTRAVENOUS
  Administered 2019-01-27 (×2): 1.2 ug/kg/h via INTRAVENOUS
  Administered 2019-01-28: 0.9 ug/kg/h via INTRAVENOUS
  Administered 2019-01-28: 1.2 ug/kg/h via INTRAVENOUS
  Administered 2019-01-28: 0.9 ug/kg/h via INTRAVENOUS
  Administered 2019-01-28: 1.2 ug/kg/h via INTRAVENOUS
  Filled 2019-01-24: qty 100
  Filled 2019-01-24 (×4): qty 50
  Filled 2019-01-24: qty 100
  Filled 2019-01-24 (×3): qty 50
  Filled 2019-01-24: qty 150
  Filled 2019-01-24 (×2): qty 100
  Filled 2019-01-24 (×7): qty 50
  Filled 2019-01-24: qty 100
  Filled 2019-01-24: qty 50
  Filled 2019-01-24: qty 100
  Filled 2019-01-24 (×2): qty 50
  Filled 2019-01-24: qty 100
  Filled 2019-01-24: qty 50

## 2019-01-24 MED ORDER — ONDANSETRON HCL 4 MG/2ML IJ SOLN: 4.0000 mg | Freq: Four times a day (QID) | INTRAMUSCULAR | Status: DC | PRN

## 2019-01-24 MED ORDER — PANTOPRAZOLE SODIUM 40 MG PO TBEC: 40.0000 mg | DELAYED_RELEASE_TABLET | Freq: Every day | ORAL | Status: DC

## 2019-01-24 MED ORDER — SUCCINYLCHOLINE CHLORIDE 200 MG/10ML IV SOSY
PREFILLED_SYRINGE | INTRAVENOUS | Status: AC
  Filled 2019-01-24: qty 10

## 2019-01-24 MED ORDER — NOREPINEPHRINE 4 MG/250ML-% IV SOLN
INTRAVENOUS | Status: AC
  Filled 2019-01-24: qty 250

## 2019-01-24 MED ORDER — ROCURONIUM BROMIDE 10 MG/ML (PF) SYRINGE
PREFILLED_SYRINGE | INTRAVENOUS | Status: AC
  Filled 2019-01-24: qty 20

## 2019-01-24 MED ORDER — FENTANYL 2500MCG IN NS 250ML (10MCG/ML) PREMIX INFUSION
0.0000 ug/h | INTRAVENOUS | Status: DC
  Administered 2019-01-24: 50 ug/h via INTRAVENOUS
  Administered 2019-01-24: 200 ug/h via INTRAVENOUS
  Administered 2019-01-25: 350 ug/h via INTRAVENOUS
  Administered 2019-01-25: 175 ug/h via INTRAVENOUS
  Administered 2019-01-25 – 2019-01-26 (×3): 350 ug/h via INTRAVENOUS
  Administered 2019-01-26 – 2019-01-27 (×3): 400 ug/h via INTRAVENOUS
  Filled 2019-01-24 (×10): qty 250

## 2019-01-24 MED ORDER — FENTANYL BOLUS VIA INFUSION
50.0000 ug | INTRAVENOUS | Status: DC | PRN
  Administered 2019-01-24 – 2019-01-25 (×9): 50 ug via INTRAVENOUS
  Filled 2019-01-24: qty 50

## 2019-01-24 MED ORDER — SODIUM CHLORIDE 0.9 % IV SOLN
INTRAVENOUS | Status: DC | PRN
  Administered 2019-01-24: 250 mL via INTRAVENOUS

## 2019-01-24 MED ORDER — ACETAMINOPHEN 650 MG RE SUPP
650.0000 mg | Freq: Four times a day (QID) | RECTAL | Status: DC | PRN
  Administered 2019-01-28 – 2019-01-29 (×2): 650 mg via RECTAL
  Filled 2019-01-24 (×3): qty 1

## 2019-01-24 MED ORDER — FENTANYL CITRATE (PF) 250 MCG/5ML IJ SOLN
INTRAMUSCULAR | Status: DC | PRN
  Administered 2019-01-23: 100 ug via INTRAVENOUS
  Administered 2019-01-23 (×3): 50 ug via INTRAVENOUS
  Administered 2019-01-24 (×2): 100 ug via INTRAVENOUS
  Administered 2019-01-24: 50 ug via INTRAVENOUS

## 2019-01-24 MED ORDER — EPHEDRINE 5 MG/ML INJ
INTRAVENOUS | Status: AC
  Filled 2019-01-24: qty 10

## 2019-01-24 MED ORDER — ROCURONIUM BROMIDE 100 MG/10ML IV SOLN
INTRAVENOUS | Status: DC | PRN
  Administered 2019-01-23 – 2019-01-24 (×4): 50 mg via INTRAVENOUS

## 2019-01-24 MED ORDER — CHLORHEXIDINE GLUCONATE 0.12% ORAL RINSE (MEDLINE KIT)
15.0000 mL | Freq: Two times a day (BID) | OROMUCOSAL | Status: DC
  Administered 2019-01-24 – 2019-01-27 (×6): 15 mL via OROMUCOSAL

## 2019-01-24 MED ORDER — PHENYLEPHRINE 40 MCG/ML (10ML) SYRINGE FOR IV PUSH (FOR BLOOD PRESSURE SUPPORT)
PREFILLED_SYRINGE | INTRAVENOUS | Status: AC
  Filled 2019-01-24: qty 10

## 2019-01-24 MED ORDER — LIDOCAINE 2% (20 MG/ML) 5 ML SYRINGE
INTRAMUSCULAR | Status: AC
  Filled 2019-01-24: qty 5

## 2019-01-24 MED ORDER — ORAL CARE MOUTH RINSE
15.0000 mL | OROMUCOSAL | Status: DC
  Administered 2019-01-24 – 2019-01-27 (×34): 15 mL via OROMUCOSAL

## 2019-01-24 MED ORDER — NOREPINEPHRINE 4 MG/250ML-% IV SOLN
0.0000 ug/min | INTRAVENOUS | Status: DC
  Administered 2019-01-24: 20 ug/min via INTRAVENOUS

## 2019-01-24 MED ORDER — SODIUM CHLORIDE 0.9 % IV SOLN
2.0000 g | Freq: Once | INTRAVENOUS | Status: AC
  Administered 2019-01-24: 2 g via INTRAVENOUS
  Filled 2019-01-24 (×2): qty 2

## 2019-01-24 MED ORDER — FENTANYL CITRATE (PF) 250 MCG/5ML IJ SOLN
INTRAMUSCULAR | Status: AC
  Filled 2019-01-24: qty 5

## 2019-01-24 MED ORDER — ALBUMIN HUMAN 5 % IV SOLN
25.0000 g | Freq: Once | INTRAVENOUS | Status: AC
  Administered 2019-01-24: 25 g via INTRAVENOUS
  Filled 2019-01-24: qty 500

## 2019-01-24 MED ORDER — HYDRALAZINE HCL 20 MG/ML IJ SOLN
10.0000 mg | INTRAMUSCULAR | Status: DC | PRN
  Administered 2019-01-24 – 2019-02-07 (×3): 10 mg via INTRAVENOUS
  Filled 2019-01-24 (×3): qty 1

## 2019-01-24 MED ORDER — PROPOFOL 1000 MG/100ML IV EMUL
0.0000 ug/kg/min | INTRAVENOUS | Status: DC
  Administered 2019-01-24: 40 ug/kg/min via INTRAVENOUS
  Administered 2019-01-24: 5 ug/kg/min via INTRAVENOUS
  Administered 2019-01-24: 50 ug/kg/min via INTRAVENOUS
  Administered 2019-01-25: 5 ug/kg/min via INTRAVENOUS
  Administered 2019-01-26: 15 ug/kg/min via INTRAVENOUS
  Administered 2019-01-26: 20 ug/kg/min via INTRAVENOUS
  Administered 2019-01-27: 40 ug/kg/min via INTRAVENOUS
  Filled 2019-01-24 (×8): qty 100

## 2019-01-24 MED ORDER — ENOXAPARIN SODIUM 40 MG/0.4ML ~~LOC~~ SOLN: 40.0000 mg | Freq: Every day | SUBCUTANEOUS | Status: DC
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
## 2019-01-24 NOTE — Progress Notes (Signed)
Notified Dr Corliss Skains that patient's pressure was in 74/53 (60) and was maintaining a SBP in the 70s on the art line. Patient easily aroused by verbal stimulation but resting comfortably. Lowered fentanyl gtt and turned off propofol gtt. Pt's urine output for shift was 500cc. Dr Corliss Skains ordered 5% of 25g of albumin. Will continue to monitor at this time.

## 2019-01-24 NOTE — Progress Notes (Signed)
Notified Kelly, PA that patient's platelet count went from 250 down to 107. Labs were recollected per lab and result was verified. No new orders at this time. Will continue to monitor.

## 2019-01-24 NOTE — Progress Notes (Signed)
Fi02 dropped to 40% per ABG results.  

## 2019-01-24 NOTE — Transfer of Care (Signed)
Immediate Anesthesia Transfer of Care Note  Patient: Narek Leno  Procedure(s) Performed: EXPLORATORY LAPAROTOMY with hepatorrhaphy, gastrorrhaphy x3, transverse colectomy, small bowel repair and small bowel resection. Left stent placement, cystomy closure. (N/A )  Patient Location: ICU  Anesthesia Type:General  Level of Consciousness: sedated and Patient remains intubated per anesthesia plan  Airway & Oxygen Therapy: Patient remains intubated per anesthesia plan and Patient placed on Ventilator (see vital sign flow sheet for setting)  Post-op Assessment: Report given to RN and Post -op Vital signs reviewed and stable  Post vital signs: Reviewed and stable  Last Vitals:  Vitals Value Taken Time  BP    Temp    Pulse 81 01/24/2019  1:41 AM  Resp 18 01/24/2019  1:41 AM  SpO2 100 % 01/24/2019  1:41 AM  Vitals shown include unvalidated device data.  Last Pain:  Vitals:   01/23/19 2246  TempSrc: Temporal  PainSc:          Complications: No apparent anesthesia complications

## 2019-01-24 NOTE — Progress Notes (Signed)
Assisted tele visit to patient with mother.  John Eland M Harace Mccluney, RN   
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
## 2019-01-24 NOTE — Anesthesia Preprocedure Evaluation (Signed)
Anesthesia Evaluation    Reviewed: Allergy & Precautions, Patient's Chart, lab work & pertinent test results  History of Anesthesia Complications Negative for: history of anesthetic complications  Airway Mallampati: II  TM Distance: >3 FB     Dental  (+) Dental Advisory Given   Pulmonary neg pulmonary ROS,    breath sounds clear to auscultation       Cardiovascular negative cardio ROS   Rhythm:Regular Rate:Normal     Neuro/Psych negative neurological ROS  negative psych ROS   GI/Hepatic Neg liver ROS,   Endo/Other  negative endocrine ROS  Renal/GU negative Renal ROS     Musculoskeletal   Abdominal   Peds  Hematology   Anesthesia Other Findings   Reproductive/Obstetrics                             Anesthesia Physical  Anesthesia Plan  ASA: III  Anesthesia Plan: General   Post-op Pain Management:    Induction: Intravenous  PONV Risk Score and Plan: 3 and Ondansetron and Dexamethasone  Airway Management Planned: Oral ETT  Additional Equipment:   Intra-op Plan:   Post-operative Plan: Possible Post-op intubation/ventilation  Informed Consent:   Plan Discussed with:   Anesthesia Plan Comments:         Anesthesia Quick Evaluation
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
## 2019-01-24 NOTE — Progress Notes (Signed)
Patient very wide awake on current sedation of 50mg  of propofol and of fentanyl gtt both of which were at their max doses. Bolus of fentanyl was given, however pt was still very alert and communicating with me via his hands. HR in the 120s. Notified Kelly, [place] and received orders to increase order parameters for the fentanyl gtt. Will continue to monitor patient.

## 2019-01-24 NOTE — Anesthesia Postprocedure Evaluation (Signed)
Anesthesia Post Note  Patient: John Briggs  Procedure(s) Performed: EXPLORATORY LAPAROTOMY with hepatorrhaphy, gastrorrhaphy x3, transverse colectomy, small bowel repair and small bowel resection. Left stent placement, cystomy closure. (N/A )     Patient location during evaluation: SICU Anesthesia Type: General Level of consciousness: patient remains intubated per anesthesia plan Pain management: pain level controlled Vital Signs Assessment: post-procedure vital signs reviewed and stable Respiratory status: patient remains intubated per anesthesia plan Cardiovascular status: stable Postop Assessment: no apparent nausea or vomiting Anesthetic complications: no    Last Vitals:  Vitals:   01/23/19 2239 01/23/19 2246  BP: (!) 88/49 (!) 80/50  Pulse: (!) 101 (!) 101  Resp:  18  Temp:  (!) 35.2 C  SpO2: 100% 97%    Last Pain:  Vitals:   01/23/19 2246  TempSrc: Temporal  PainSc:                  John Briggs

## 2019-01-24 NOTE — Anesthesia Procedure Notes (Signed)
Procedure Name: Intubation Date/Time: 01/23/2019 11:18 PM Performed by: Claudina Lick, CRNA Pre-anesthesia Checklist: Patient identified, Emergency Drugs available, Suction available, Patient being monitored and Timeout performed Patient Re-evaluated:Patient Re-evaluated prior to induction Oxygen Delivery Method: Circle system utilized Preoxygenation: Pre-oxygenation with 100% oxygen Induction Type: IV induction, Rapid sequence and Cricoid Pressure applied Laryngoscope Size: Miller and 2 Grade View: Grade I Tube type: Oral Tube size: 8.0 mm Number of attempts: 1 Airway Equipment and Method: Stylet Placement Confirmation: ETT inserted through vocal cords under direct vision,  positive ETCO2 and breath sounds checked- equal and bilateral Secured at: 23 cm Tube secured with: Tape Dental Injury: Teeth and Oropharynx as per pre-operative assessment

## 2019-01-24 NOTE — Anesthesia Procedure Notes (Signed)
Arterial Line Insertion Start/End5/09/2018 11:31 PM, 01/24/2019 12:01 AM Performed by: Adonis Housekeeper, CRNA, CRNA  Preanesthetic checklist: patient identified, IV checked, site marked, monitors and equipment checked, pre-op evaluation and timeout performed Patient sedated radial was placed Catheter size: 20 G Hand hygiene performed  and maximum sterile barriers used   Attempts: 1 Procedure performed without using ultrasound guided technique. Ultrasound Notes:anatomy identified, needle tip was noted to be adjacent to the nerve/plexus identified and no ultrasound evidence of intravascular and/or intraneural injection Following insertion, Biopatch. Patient tolerated the procedure well with no immediate complications.

## 2019-01-25 ENCOUNTER — Inpatient Hospital Stay (HOSPITAL_COMMUNITY): Payer: No Typology Code available for payment source | Admitting: Certified Registered"

## 2019-01-25 ENCOUNTER — Encounter (HOSPITAL_COMMUNITY): Admission: EM | Disposition: A | Discharge status: Home / Self Care | Payer: Self-pay | Source: Home / Self Care

## 2019-01-25 ENCOUNTER — Inpatient Hospital Stay (HOSPITAL_COMMUNITY): Payer: No Typology Code available for payment source

## 2019-01-25 HISTORY — PX: APPLICATION OF WOUND VAC: SHX5189

## 2019-01-25 HISTORY — PX: LAPAROTOMY: SHX154

## 2019-01-25 LAB — CBC
HCT: 31 % — ABNORMAL LOW (ref 39.0–52.0)
Hemoglobin: 11 g/dL — ABNORMAL LOW (ref 13.0–17.0)
MCH: 32.4 pg (ref 26.0–34.0)
MCHC: 35.5 g/dL (ref 30.0–36.0)
MCV: 91.2 fL (ref 80.0–100.0)
Platelets: 75 10*3/uL — ABNORMAL LOW (ref 150–400)
RBC: 3.4 MIL/uL — ABNORMAL LOW (ref 4.22–5.81)
RDW: 17 % — ABNORMAL HIGH (ref 11.5–15.5)
WBC: 19.8 10*3/uL — ABNORMAL HIGH (ref 4.0–10.5)
nRBC: 0 % (ref 0.0–0.2)

## 2019-01-25 LAB — BASIC METABOLIC PANEL
Anion gap: 10 (ref 5–15)
BUN: 23 mg/dL — ABNORMAL HIGH (ref 6–20)
CO2: 19 mmol/L — ABNORMAL LOW (ref 22–32)
Calcium: 7 mg/dL — ABNORMAL LOW (ref 8.9–10.3)
Chloride: 110 mmol/L (ref 98–111)
Creatinine, Ser: 2.22 mg/dL — ABNORMAL HIGH (ref 0.61–1.24)
GFR calc Af Amer: 44 mL/min — ABNORMAL LOW (ref >60)
GFR calc non Af Amer: 38 mL/min — ABNORMAL LOW (ref >60)
Glucose, Bld: 107 mg/dL — ABNORMAL HIGH (ref 70–99)
Potassium: 3.9 mmol/L (ref 3.5–5.1)
Sodium: 139 mmol/L (ref 135–145)

## 2019-01-25 LAB — TRIGLYCERIDES: Triglycerides: 210 mg/dL — ABNORMAL HIGH (ref <150)

## 2019-01-25 SURGERY — LAPAROTOMY, EXPLORATORY
Anesthesia: General | Site: Abdomen

## 2019-01-25 MED ORDER — SUGAMMADEX SODIUM 200 MG/2ML IV SOLN
INTRAVENOUS | Status: DC | PRN
  Administered 2019-01-25: 180 mg via INTRAVENOUS

## 2019-01-25 MED ORDER — ROCURONIUM BROMIDE 10 MG/ML (PF) SYRINGE
PREFILLED_SYRINGE | INTRAVENOUS | Status: AC
  Filled 2019-01-25: qty 10

## 2019-01-25 MED ORDER — FENTANYL CITRATE (PF) 250 MCG/5ML IJ SOLN
INTRAMUSCULAR | Status: AC
  Filled 2019-01-25: qty 5

## 2019-01-25 MED ORDER — LACTATED RINGERS IV SOLN
INTRAVENOUS | Status: DC | PRN
  Administered 2019-01-25: 08:00:00 via INTRAVENOUS

## 2019-01-25 MED ORDER — DEXMEDETOMIDINE HCL 200 MCG/2ML IV SOLN: INTRAVENOUS | Status: DC | PRN

## 2019-01-25 MED ORDER — ROCURONIUM BROMIDE 100 MG/10ML IV SOLN
INTRAVENOUS | Status: DC | PRN
  Administered 2019-01-25: 20 mg via INTRAVENOUS

## 2019-01-25 MED ORDER — MIDAZOLAM HCL 5 MG/5ML IJ SOLN
INTRAMUSCULAR | Status: DC | PRN
  Administered 2019-01-25: 2 mg via INTRAVENOUS

## 2019-01-25 MED ORDER — FENTANYL CITRATE (PF) 250 MCG/5ML IJ SOLN
INTRAMUSCULAR | Status: DC | PRN
  Administered 2019-01-25: 50 ug via INTRAVENOUS

## 2019-01-25 MED ORDER — PROPOFOL 10 MG/ML IV BOLUS
INTRAVENOUS | Status: AC
  Filled 2019-01-25: qty 20

## 2019-01-25 MED ORDER — 0.9 % SODIUM CHLORIDE (POUR BTL) OPTIME
TOPICAL | Status: DC | PRN
  Administered 2019-01-25: 2000 mL
  Administered 2019-01-25: 09:00:00 1000 mL

## 2019-01-25 MED ORDER — PIPERACILLIN-TAZOBACTAM 3.375 G IVPB 30 MIN
3.3750 g | Freq: Once | INTRAVENOUS | Status: AC
  Administered 2019-01-25: 3.375 g via INTRAVENOUS
  Filled 2019-01-25: qty 50

## 2019-01-25 MED ORDER — MIDAZOLAM HCL 2 MG/2ML IJ SOLN
INTRAMUSCULAR | Status: AC
  Filled 2019-01-25: qty 2

## 2019-01-25 SURGICAL SUPPLY — 29 items
BENZOIN TINCTURE PRP APPL 2/3 (GAUZE/BANDAGES/DRESSINGS) ×8 IMPLANT
CANISTER WOUND CARE 500ML ATS (WOUND CARE) ×4 IMPLANT
COVER SURGICAL LIGHT HANDLE (MISCELLANEOUS) ×4 IMPLANT
DRAIN CHANNEL 19F RND (DRAIN) IMPLANT
DRAPE LAPAROSCOPIC ABDOMINAL (DRAPES) ×4 IMPLANT
DRAPE WARM FLUID 44X44 (DRAPE) ×4 IMPLANT
ELECT REM PT RETURN 9FT ADLT (ELECTROSURGICAL) ×4
ELECTRODE REM PT RTRN 9FT ADLT (ELECTROSURGICAL) ×2 IMPLANT
EVACUATOR SILICONE 100CC (DRAIN) IMPLANT
GLOVE BIOGEL M STRL SZ7.5 (GLOVE) ×4 IMPLANT
GLOVE INDICATOR 8.0 STRL GRN (GLOVE) ×4 IMPLANT
GOWN STRL REUS W/ TWL LRG LVL3 (GOWN DISPOSABLE) ×4 IMPLANT
GOWN STRL REUS W/ TWL XL LVL3 (GOWN DISPOSABLE) ×2 IMPLANT
GOWN STRL REUS W/TWL LRG LVL3 (GOWN DISPOSABLE) ×4
GOWN STRL REUS W/TWL XL LVL3 (GOWN DISPOSABLE) ×2
KIT BASIN OR (CUSTOM PROCEDURE TRAY) ×4 IMPLANT
KIT TURNOVER KIT B (KITS) ×4 IMPLANT
NS IRRIG 1000ML POUR BTL (IV SOLUTION) ×12 IMPLANT
PACK GENERAL/GYN (CUSTOM PROCEDURE TRAY) ×4 IMPLANT
PAD ARMBOARD 7.5X6 YLW CONV (MISCELLANEOUS) ×4 IMPLANT
PENCIL SMOKE EVACUATOR (MISCELLANEOUS) ×4 IMPLANT
SPONGE ABD ABTHERA ADVANCE (MISCELLANEOUS) ×4 IMPLANT
SUCTION POOLE TIP (SUCTIONS) ×4 IMPLANT
SUT ETHILON 3 0 PS 1 (SUTURE) IMPLANT
SUT PDS AB 1 TP1 96 (SUTURE) ×8 IMPLANT
SUT SILK 2 0 SH CR/8 (SUTURE) ×4 IMPLANT
SUT SILK 2 0 TIES 10X30 (SUTURE) ×4 IMPLANT
SUT SILK 3 0 TIES 10X30 (SUTURE) ×4 IMPLANT
TOWEL OR 17X26 10 PK STRL BLUE (TOWEL DISPOSABLE) ×4 IMPLANT

## 2019-01-25 NOTE — Anesthesia Postprocedure Evaluation (Signed)
Anesthesia Post Note  Patient: John Briggs  Procedure(s) Performed: Reopening of recent laparotomy, abdominal wound washout, partial omentectomy (N/A Abdomen) Application Of Wound Vac (Abdomen)     Patient location during evaluation: SICU Anesthesia Type: General Level of consciousness: sedated Pain management: pain level controlled Vital Signs Assessment: post-procedure vital signs reviewed and stable Respiratory status: patient remains intubated per anesthesia plan Cardiovascular status: stable Postop Assessment: no apparent nausea or vomiting Anesthetic complications: no    Last Vitals:  Vitals:   01/25/19 0905 01/25/19 0915  BP: (!) 133/95 (!) 128/92  Pulse: 79 80  Resp: 18 18  Temp:    SpO2: 100% 100%    Last Pain:  Vitals:   01/25/19 0500  TempSrc: Axillary  PainSc:                  John Briggs DANIEL

## 2019-01-25 NOTE — Progress Notes (Signed)
Assisted tele visit to patient with family member.  Armanie Ullmer Samson, RN  

## 2019-01-25 NOTE — Progress Notes (Signed)
Video call with fiance completed. Patient alert, waving to camera and writing on board for communication.

## 2019-01-25 NOTE — Transfer of Care (Signed)
Immediate Anesthesia Transfer of Care Note  Patient: John Briggs  Procedure(s) Performed: Reopening of recent laparotomy, abdominal wound washout, partial omentectomy (N/A Abdomen)  Patient Location: ICU  Anesthesia Type:General  Level of Consciousness: Patient remains intubated per anesthesia plan  Airway & Oxygen Therapy: Patient remains intubated per anesthesia plan and Patient placed on Ventilator (see vital sign flow sheet for setting)  Post-op Assessment: Report given to RN and Post -op Vital signs reviewed and stable  Post vital signs: Reviewed and stable  Last Vitals:  Vitals Value Taken Time  BP 133/95 01/25/2019  9:02 AM  Temp    Pulse 79 01/25/2019  9:09 AM  Resp 18 01/25/2019  9:09 AM  SpO2 100 % 01/25/2019  9:09 AM  Vitals shown include unvalidated device data.  Last Pain:  Vitals:   01/25/19 0500  TempSrc: Axillary  PainSc:          Complications: No apparent anesthesia complications
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
## 2019-01-25 NOTE — Progress Notes (Signed)
Assisted tele visit to patient with family member.  Cloa Bushong Samson, RN  
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
## 2019-01-26 LAB — BASIC METABOLIC PANEL
Anion gap: 11 (ref 5–15)
BUN: 22 mg/dL — ABNORMAL HIGH (ref 6–20)
CO2: 18 mmol/L — ABNORMAL LOW (ref 22–32)
Calcium: 6.9 mg/dL — ABNORMAL LOW (ref 8.9–10.3)
Chloride: 111 mmol/L (ref 98–111)
Creatinine, Ser: 2.11 mg/dL — ABNORMAL HIGH (ref 0.61–1.24)
GFR calc Af Amer: 47 mL/min — ABNORMAL LOW (ref >60)
GFR calc non Af Amer: 40 mL/min — ABNORMAL LOW (ref >60)
Glucose, Bld: 105 mg/dL — ABNORMAL HIGH (ref 70–99)
Potassium: 3.3 mmol/L — ABNORMAL LOW (ref 3.5–5.1)
Sodium: 140 mmol/L (ref 135–145)

## 2019-01-26 LAB — TYPE AND SCREEN
ABO/RH(D): O NEG
Antibody Screen: NEGATIVE
Unit division: 0
Unit division: 0
Unit division: 0
Unit division: 0
Unit division: 0
Unit division: 0
Unit division: 0
Unit division: 0
Unit division: 0
Unit division: 0
Unit division: 0
Unit division: 0
Unit division: 0
Unit division: 0

## 2019-01-26 LAB — BPAM RBC
Blood Product Expiration Date: 202005042359
Blood Product Expiration Date: 202005052359
Blood Product Expiration Date: 202005062359
Blood Product Expiration Date: 202005062359
Blood Product Expiration Date: 202005062359
Blood Product Expiration Date: 202005062359
Blood Product Expiration Date: 202005062359
Blood Product Expiration Date: 202005072359
Blood Product Expiration Date: 202005072359
Blood Product Expiration Date: 202005072359
Blood Product Expiration Date: 202005072359
Blood Product Expiration Date: 202005072359
Blood Product Expiration Date: 202005072359
Blood Product Expiration Date: 202005072359
ISSUE DATE / TIME: 202004302238
ISSUE DATE / TIME: 202004302238
ISSUE DATE / TIME: 202004302316
ISSUE DATE / TIME: 202004302327
ISSUE DATE / TIME: 202004302353
ISSUE DATE / TIME: 202005010015
ISSUE DATE / TIME: 202005010015
ISSUE DATE / TIME: 202005010015
ISSUE DATE / TIME: 202005010609
ISSUE DATE / TIME: 202005011056
ISSUE DATE / TIME: 202005011330
ISSUE DATE / TIME: 202005011700
ISSUE DATE / TIME: 202005011705
ISSUE DATE / TIME: 202005030719
Unit Type and Rh: 5100
Unit Type and Rh: 5100
Unit Type and Rh: 5100
Unit Type and Rh: 5100
Unit Type and Rh: 5100
Unit Type and Rh: 5100
Unit Type and Rh: 5100
Unit Type and Rh: 5100
Unit Type and Rh: 5100
Unit Type and Rh: 5100
Unit Type and Rh: 5100
Unit Type and Rh: 5100
Unit Type and Rh: 9500
Unit Type and Rh: 9500

## 2019-01-26 LAB — CBC
HCT: 28.6 % — ABNORMAL LOW (ref 39.0–52.0)
Hemoglobin: 9.9 g/dL — ABNORMAL LOW (ref 13.0–17.0)
MCH: 31.7 pg (ref 26.0–34.0)
MCHC: 34.6 g/dL (ref 30.0–36.0)
MCV: 91.7 fL (ref 80.0–100.0)
Platelets: 79 10*3/uL — ABNORMAL LOW (ref 150–400)
RBC: 3.12 MIL/uL — ABNORMAL LOW (ref 4.22–5.81)
RDW: 15.9 % — ABNORMAL HIGH (ref 11.5–15.5)
WBC: 19.9 10*3/uL — ABNORMAL HIGH (ref 4.0–10.5)
nRBC: 0 % (ref 0.0–0.2)

## 2019-01-26 LAB — TRIGLYCERIDES: Triglycerides: 216 mg/dL — ABNORMAL HIGH (ref <150)

## 2019-01-26 MED ORDER — NICOTINE 14 MG/24HR TD PT24
14.0000 mg | MEDICATED_PATCH | Freq: Every day | TRANSDERMAL | Status: DC
  Administered 2019-01-26: 14 mg via TRANSDERMAL
  Filled 2019-01-26 (×2): qty 1

## 2019-01-26 MED ORDER — HEPARIN SODIUM (PORCINE) 5000 UNIT/ML IJ SOLN
5000.0000 [IU] | Freq: Three times a day (TID) | INTRAMUSCULAR | Status: DC
  Administered 2019-01-26 – 2019-01-31 (×15): 5000 [IU] via SUBCUTANEOUS
  Filled 2019-01-26 (×14): qty 1

## 2019-01-26 MED ORDER — POTASSIUM CHLORIDE 10 MEQ/100ML IV SOLN
10.0000 meq | INTRAVENOUS | Status: AC
  Administered 2019-01-26 (×2): 10 meq via INTRAVENOUS
  Filled 2019-01-26 (×2): qty 100

## 2019-01-26 NOTE — Progress Notes (Signed)
CSW at this time is unable to assist with HCPOA process. CSW notes Chaplain services should be consulted. CSW did discuss with family options they have to help support patient and provided information regarding legal incompetency process and [facility] POA when patient has limitations with communications. CSW is signing off at this time, please re-consult for future social work needs.  Tenna Delaine, LCSW, LCAS-A Clinical Social Worker II [phone]
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
## 2019-01-26 NOTE — Progress Notes (Signed)
Urology Progress Note   S/p left ureteral stent placement and cystotomy closure on 01/24/19  Interval events / subjective: Taken back to OR yesterday for washout and possible abdominal closure. Not able to close abdomen and temporary abdominal closure device was placed. He remains intubated and sedated in the ICU. Alert this morning when seen on rounds; concern for IV infiltration. Creatinine slightly improved today to 2.1. No issues with catheter and draining well.   Objective: Vital signs in last 24 hours: Temp:  [98.1 F (36.7 C)-100.2 F (37.9 C)] 98.4 F (36.9 C) (05/03 1200) Pulse Rate:  [75-105] 75 (05/03 1300) Resp:  [17-24] 18 (05/03 1300) BP: (87-141)/(63-108) 141/100 (05/03 1300) SpO2:  [99 %-100 %] 100 % (05/03 1300) Arterial Line BP: (118-164)/(55-93) 153/82 (05/03 1300) FiO2 (%):  [30 %] 30 % (05/03 1200)  Intake/Output from previous day: 05/02 0701 - 05/03 0700 In: 4106.1 [I.V.:4056.1; IV Piggyback:50] Out: 1930 [Urine:1750; Drains:175; Blood:5] Intake/Output this shift: Total I/O In: 1332.4 [I.V.:1230.9; IV Piggyback:101.5] Out: -   Physical Exam:  General: eyes open and trying to write on paper CV: extremities warm and well perfused Lungs: intubated Abdomen: Soft, abthera in place GU: Foley in place draining clear light orange / pink urine Ext: NT, No erythema  Lab Results: Recent Labs    01/24/19 0239 01/25/19 0504 01/26/19 0425  HGB 12.6* 11.0* 9.9*  HCT 37.0* 31.0* 28.6*   BMET Recent Labs    01/25/19 0504 01/26/19 0425  NA 139 140  K 3.9 3.3*  CL 110 111  CO2 19* 18*  GLUCOSE 107* 105*  BUN 23* 22*  CREATININE 2.22* 2.11*  CALCIUM 7.0* 6.9*     Studies/Results: Dg Chest Port 1 View  Result Date: 01/25/2019 CLINICAL DATA:  Endotracheal tube placement. EXAM: PORTABLE CHEST 1 VIEW COMPARISON:  Chest x-ray dated January 23, 2019. FINDINGS: Endotracheal tube in place with the tip 3.7 cm above the carina. Enteric tube in the stomach. The  heart size and mediastinal contours are within normal limits. Normal pulmonary vascularity. No focal consolidation, pleural effusion, or pneumothorax. No acute osseous abnormality. IMPRESSION: 1. Appropriately positioned endotracheal and enteric tubes. 2. No active disease. Electronically Signed   By: Obie Dredge M.D.   On: 01/25/2019 07:46    Assessment/Plan:  33 y.o. male s/p cystotomy closure and placement of left ureteral stent. Excellent urine output, creatinine is stable at 2.11, and hematuria improving. Plan for general surgery to take back to OR tomorrow for possible closure and colostomy.  - Continue foley catheter to drainage.  - When patient's abdomen is closed, please leave a drain in the patient's pelvis by the bladder closure. - Please contact urology with any additional questions or concerns.     LOS: 2 days

## 2019-01-26 NOTE — Progress Notes (Addendum)
Pt next of kin, Joni Reining (both parents and brother are dead and daughter is underage) states pt is not capable to make his own decisions and wants information about becoming POA. States he does not have a documented disability and states he doesn't know how to properly or safely care for himself. He also drinks several drinks a day. Also cares for mentally disabled girlfriend who is currently at her fathers house. Social work consulted.

## 2019-01-27 ENCOUNTER — Encounter (HOSPITAL_COMMUNITY): Payer: Self-pay | Admitting: Surgery

## 2019-01-27 ENCOUNTER — Inpatient Hospital Stay (HOSPITAL_COMMUNITY): Payer: No Typology Code available for payment source | Admitting: Anesthesiology

## 2019-01-27 ENCOUNTER — Encounter (HOSPITAL_COMMUNITY): Admission: EM | Disposition: A | Discharge status: Home / Self Care | Payer: Self-pay | Source: Home / Self Care

## 2019-01-27 HISTORY — PX: COLOSTOMY: SHX63

## 2019-01-27 HISTORY — PX: LAPAROTOMY: SHX154

## 2019-01-27 HISTORY — PX: APPLICATION OF WOUND VAC: SHX5189

## 2019-01-27 LAB — CBC
HCT: 28.5 % — ABNORMAL LOW (ref 39.0–52.0)
Hemoglobin: 10.1 g/dL — ABNORMAL LOW (ref 13.0–17.0)
MCH: 32.2 pg (ref 26.0–34.0)
MCHC: 35.4 g/dL (ref 30.0–36.0)
MCV: 90.8 fL (ref 80.0–100.0)
Platelets: 111 10*3/uL — ABNORMAL LOW (ref 150–400)
RBC: 3.14 MIL/uL — ABNORMAL LOW (ref 4.22–5.81)
RDW: 15 % (ref 11.5–15.5)
WBC: 18.7 10*3/uL — ABNORMAL HIGH (ref 4.0–10.5)
nRBC: 0 % (ref 0.0–0.2)

## 2019-01-27 LAB — BASIC METABOLIC PANEL
Anion gap: 8 (ref 5–15)
BUN: 14 mg/dL (ref 6–20)
CO2: 19 mmol/L — ABNORMAL LOW (ref 22–32)
Calcium: 7.9 mg/dL — ABNORMAL LOW (ref 8.9–10.3)
Chloride: 112 mmol/L — ABNORMAL HIGH (ref 98–111)
Creatinine, Ser: 1.62 mg/dL — ABNORMAL HIGH (ref 0.61–1.24)
GFR calc Af Amer: >60 mL/min (ref >60)
GFR calc non Af Amer: 55 mL/min — ABNORMAL LOW (ref >60)
Glucose, Bld: 102 mg/dL — ABNORMAL HIGH (ref 70–99)
Potassium: 3.3 mmol/L — ABNORMAL LOW (ref 3.5–5.1)
Sodium: 139 mmol/L (ref 135–145)

## 2019-01-27 LAB — TRIGLYCERIDES: Triglycerides: 204 mg/dL — ABNORMAL HIGH (ref <150)

## 2019-01-27 LAB — SARS CORONAVIRUS 2 BY RT PCR (HOSPITAL ORDER, PERFORMED IN ~~LOC~~ HOSPITAL LAB): SARS Coronavirus 2: NEGATIVE

## 2019-01-27 SURGERY — LAPAROTOMY, EXPLORATORY
Anesthesia: General | Site: Abdomen

## 2019-01-27 MED ORDER — NICOTINE 14 MG/24HR TD PT24
14.0000 mg | MEDICATED_PATCH | Freq: Every day | TRANSDERMAL | Status: DC
  Administered 2019-01-27 – 2019-02-11 (×15): 14 mg via TRANSDERMAL
  Filled 2019-01-27 (×17): qty 1

## 2019-01-27 MED ORDER — 0.9 % SODIUM CHLORIDE (POUR BTL) OPTIME
TOPICAL | Status: DC | PRN
  Administered 2019-01-27 (×2): 1000 mL

## 2019-01-27 MED ORDER — ORAL CARE MOUTH RINSE
15.0000 mL | Freq: Two times a day (BID) | OROMUCOSAL | Status: DC
  Administered 2019-01-27 – 2019-02-05 (×18): 15 mL via OROMUCOSAL

## 2019-01-27 MED ORDER — MIDAZOLAM HCL 5 MG/5ML IJ SOLN
INTRAMUSCULAR | Status: DC | PRN
  Administered 2019-01-27: 2 mg via INTRAVENOUS

## 2019-01-27 MED ORDER — ADULT MULTIVITAMIN W/MINERALS CH
1.0000 | ORAL_TABLET | Freq: Every day | ORAL | Status: DC
  Filled 2019-01-27: qty 1

## 2019-01-27 MED ORDER — PANTOPRAZOLE SODIUM 40 MG PO TBEC
40.0000 mg | DELAYED_RELEASE_TABLET | Freq: Every day | ORAL | Status: DC
  Administered 2019-01-28 – 2019-02-11 (×12): 40 mg via ORAL
  Filled 2019-01-27 (×12): qty 1

## 2019-01-27 MED ORDER — PIPERACILLIN-TAZOBACTAM 3.375 G IVPB 30 MIN
3.3750 g | INTRAVENOUS | Status: AC
  Administered 2019-01-27: 3.375 g via INTRAVENOUS
  Filled 2019-01-27: qty 50

## 2019-01-27 MED ORDER — ROCURONIUM BROMIDE 50 MG/5ML IV SOSY
PREFILLED_SYRINGE | INTRAVENOUS | Status: DC | PRN
  Administered 2019-01-27 (×2): 50 mg via INTRAVENOUS

## 2019-01-27 MED ORDER — FOLIC ACID 1 MG PO TABS
1.0000 mg | ORAL_TABLET | Freq: Every day | ORAL | Status: DC
  Administered 2019-01-27 – 2019-02-11 (×14): 1 mg via ORAL
  Filled 2019-01-27 (×16): qty 1

## 2019-01-27 MED ORDER — ADULT MULTIVITAMIN LIQUID CH
15.0000 mL | Freq: Every day | ORAL | Status: DC
  Administered 2019-01-27 – 2019-02-11 (×13): 15 mL via ORAL
  Filled 2019-01-27 (×16): qty 15

## 2019-01-27 MED ORDER — HYDROMORPHONE HCL 1 MG/ML IJ SOLN
1.0000 mg | INTRAMUSCULAR | Status: DC | PRN
  Administered 2019-01-27 – 2019-01-28 (×8): 1 mg via INTRAVENOUS
  Filled 2019-01-27 (×8): qty 1

## 2019-01-27 MED ORDER — FENTANYL CITRATE (PF) 250 MCG/5ML IJ SOLN
INTRAMUSCULAR | Status: AC
  Filled 2019-01-27: qty 5

## 2019-01-27 MED ORDER — VITAMIN B-1 100 MG PO TABS
100.0000 mg | ORAL_TABLET | Freq: Every day | ORAL | Status: DC
  Administered 2019-01-27 – 2019-02-11 (×14): 100 mg via ORAL
  Filled 2019-01-27 (×16): qty 1

## 2019-01-27 MED ORDER — OXYCODONE HCL 5 MG PO TABS
5.0000 mg | ORAL_TABLET | Freq: Four times a day (QID) | ORAL | Status: DC | PRN
  Administered 2019-01-27 – 2019-01-28 (×3): 5 mg via ORAL
  Filled 2019-01-27 (×3): qty 1

## 2019-01-27 MED ORDER — PROPOFOL 10 MG/ML IV BOLUS
INTRAVENOUS | Status: DC | PRN
  Administered 2019-01-27: 50 mg via INTRAVENOUS

## 2019-01-27 MED ORDER — PANTOPRAZOLE SODIUM 40 MG IV SOLR
40.0000 mg | Freq: Every day | INTRAVENOUS | Status: DC
  Administered 2019-01-27 – 2019-02-01 (×4): 40 mg via INTRAVENOUS
  Filled 2019-01-27 (×7): qty 40

## 2019-01-27 MED ORDER — MIDAZOLAM HCL 2 MG/2ML IJ SOLN
INTRAMUSCULAR | Status: AC
  Filled 2019-01-27: qty 2

## 2019-01-27 MED ORDER — LACTATED RINGERS IV SOLN
INTRAVENOUS | Status: DC | PRN
  Administered 2019-01-27: 08:00:00 via INTRAVENOUS

## 2019-01-27 MED ORDER — SUGAMMADEX SODIUM 200 MG/2ML IV SOLN
INTRAVENOUS | Status: DC | PRN
  Administered 2019-01-27: 200 mg via INTRAVENOUS

## 2019-01-27 SURGICAL SUPPLY — 39 items
CHLORAPREP W/TINT 26ML (MISCELLANEOUS) IMPLANT
CLIP VESOCCLUDE LG 6/CT (CLIP) IMPLANT
CLIP VESOCCLUDE MED 6/CT (CLIP) IMPLANT
CLIP VESOCCLUDE SM WIDE 6/CT (CLIP) IMPLANT
COVER SURGICAL LIGHT HANDLE (MISCELLANEOUS) ×4 IMPLANT
COVER WAND RF STERILE (DRAPES) IMPLANT
DRAPE LAPAROSCOPIC ABDOMINAL (DRAPES) ×4 IMPLANT
DRAPE WARM FLUID 44X44 (DRAPE) ×4 IMPLANT
DRSG OPSITE POSTOP 4X10 (GAUZE/BANDAGES/DRESSINGS) IMPLANT
DRSG OPSITE POSTOP 4X8 (GAUZE/BANDAGES/DRESSINGS) IMPLANT
ELECT BLADE 6.5 EXT (BLADE) IMPLANT
ELECT CAUTERY BLADE 6.4 (BLADE) IMPLANT
ELECT REM PT RETURN 9FT ADLT (ELECTROSURGICAL) ×4
ELECTRODE REM PT RTRN 9FT ADLT (ELECTROSURGICAL) ×2 IMPLANT
GLOVE BIOGEL PI IND STRL 7.0 (GLOVE) ×2 IMPLANT
GLOVE BIOGEL PI INDICATOR 7.0 (GLOVE) ×2
GLOVE SURG SS PI 7.0 STRL IVOR (GLOVE) ×4 IMPLANT
GOWN STRL REUS W/ TWL LRG LVL3 (GOWN DISPOSABLE) ×6 IMPLANT
GOWN STRL REUS W/TWL LRG LVL3 (GOWN DISPOSABLE) ×6
KIT BASIN OR (CUSTOM PROCEDURE TRAY) ×4 IMPLANT
LIGASURE IMPACT 36 18CM CVD LR (INSTRUMENTS) IMPLANT
LOOP VESSEL MAXI BLUE (MISCELLANEOUS) IMPLANT
LOOP VESSEL MINI RED (MISCELLANEOUS) IMPLANT
NEEDLE 22X1 1/2 (OR ONLY) (NEEDLE) IMPLANT
PACK GENERAL/GYN (CUSTOM PROCEDURE TRAY) ×4 IMPLANT
PENCIL SMOKE EVACUATOR (MISCELLANEOUS) ×4 IMPLANT
SPONGE LAP 18X18 RF (DISPOSABLE) IMPLANT
STAPLER VISISTAT 35W (STAPLE) IMPLANT
SUCTION POOLE TIP (SUCTIONS) ×4 IMPLANT
SUT PDS AB 1 TP1 96 (SUTURE) IMPLANT
SUT SILK 2 0 (SUTURE) ×2
SUT SILK 2 0 SH CR/8 (SUTURE) ×4 IMPLANT
SUT SILK 2-0 18XBRD TIE 12 (SUTURE) ×2 IMPLANT
SUT SILK 3 0 (SUTURE) ×2
SUT SILK 3 0 SH CR/8 (SUTURE) ×4 IMPLANT
SUT SILK 3-0 18XBRD TIE 12 (SUTURE) ×2 IMPLANT
TOWEL OR 17X26 10 PK STRL BLUE (TOWEL DISPOSABLE) ×4 IMPLANT
TRAY FOLEY MTR SLVR 16FR STAT (SET/KITS/TRAYS/PACK) ×4 IMPLANT
YANKAUER SUCT BULB TIP NO VENT (SUCTIONS) IMPLANT

## 2019-01-27 NOTE — Anesthesia Postprocedure Evaluation (Signed)
Anesthesia Post Note  Patient: Stelios Mcghie  Procedure(s) Performed: EXPLORATORY LAPAROTOMY (N/A Abdomen) Colostomy (Abdomen) Application Of Wound Vac (Abdomen)     Patient location during evaluation: SICU Anesthesia Type: General Level of consciousness: sedated Pain management: pain level controlled Vital Signs Assessment: post-procedure vital signs reviewed and stable Respiratory status: patient remains intubated per anesthesia plan Cardiovascular status: stable Postop Assessment: no apparent nausea or vomiting Anesthetic complications: no    Last Vitals:  Vitals:   01/27/19 0755 01/27/19 0800  BP: (!) 145/101 (!) 144/102  Pulse: 81 74  Resp: 18 18  Temp:  36.9 C  SpO2: 100% 100%    Last Pain:  Vitals:   01/27/19 0800  TempSrc: Axillary  PainSc:                  Shelton Silvas

## 2019-01-27 NOTE — Procedures (Signed)
Extubation Procedure Note  Patient Details:   Name: [name] DOB: 12-17-1985 MRN: [id number]   Airway Documentation:    Vent end date: 01/27/19 Vent end time: 1257   Evaluation  O2 sats: stable throughout Complications: No apparent complications Patient did tolerate procedure well. Bilateral Breath Sounds: Clear, Diminished   Yes   Positive cuff leak noted.  Patient placed on [place] 3 L with humidity, no stridor noted.  Forest Becker Solon Alban 01/27/2019, 1:02 PM

## 2019-01-27 NOTE — Progress Notes (Signed)
I spoke with his sister Joni Reining and updated on the results of the surgery and plan to start weaning toward extubation as able and that he has a colostomy which will be present for at least 6 months. All questions were answered and she showed understanding.

## 2019-01-27 NOTE — Consult Note (Signed)
WOC Nurse wound consult note Reason for Consult:WOund VAC.  Applied in OR today.  Will change M/W/F.  New ostomy. WOC team will resume care on 01/29/2019 with VAC dressing changes.  Maple Hudson MSN, RN, FNP-BC CWON Wound, Ostomy, Continence Nurse Pager [phone]

## 2019-01-27 NOTE — Anesthesia Preprocedure Evaluation (Addendum)
Anesthesia Evaluation  Patient identified by MRN, date of birth, ID band Patient awake    Reviewed: Allergy & Precautions, NPO status , Patient's Chart, lab work & pertinent test results  Airway Mallampati: Intubated       Dental  (+) Teeth Intact   Pulmonary neg pulmonary ROS,     + decreased breath sounds      Cardiovascular negative cardio ROS   Rhythm:Regular Rate:Normal     Neuro/Psych negative neurological ROS     GI/Hepatic negative GI ROS, Neg liver ROS,   Endo/Other  negative endocrine ROS  Renal/GU negative Renal ROS     Musculoskeletal negative musculoskeletal ROS (+)   Abdominal Normal abdominal exam  (+)   Peds  Hematology negative hematology ROS (+)   Anesthesia Other Findings   Reproductive/Obstetrics                            Anesthesia Physical Anesthesia Plan  ASA: III  Anesthesia Plan: General   Post-op Pain Management:    Induction: Intravenous  PONV Risk Score and Plan: 3 and Ondansetron, Dexamethasone and Midazolam  Airway Management Planned: Oral ETT  Additional Equipment: None  Intra-op Plan:   Post-operative Plan: Post-operative intubation/ventilation  Informed Consent: I have reviewed the patients History and Physical, chart, labs and discussed the procedure including the risks, benefits and alternatives for the proposed anesthesia with the patient or authorized representative who has indicated his/her understanding and acceptance.       Plan Discussed with: CRNA  Anesthesia Plan Comments:        Anesthesia Quick Evaluation

## 2019-01-27 NOTE — Progress Notes (Signed)
RT instructed patient on the use of incentive spirometer. Patient able to reach 1250 mL with good effort.

## 2019-01-27 NOTE — Progress Notes (Signed)
Concern in the OR for no COVID test. Dr. Janee Morn did perform a questionnaire which were all negative. The OR team did wear n95 and other PPE for the case. After the procedure I spoke with CMO Frederik Schmidt who stated the team does not need to continue airborne precautions due to the negative screen and that we could get an in house test to finalize this issue.

## 2019-01-27 NOTE — Transfer of Care (Signed)
Immediate Anesthesia Transfer of Care Note  Patient: John Briggs  Procedure(s) Performed: EXPLORATORY LAPAROTOMY (N/A Abdomen) Colostomy (Abdomen) Application Of Wound Vac (Abdomen)  Patient Location: ICU  Anesthesia Type:General  Level of Consciousness: sedated and Patient remains intubated per anesthesia plan  Airway & Oxygen Therapy: Patient remains intubated per anesthesia plan and Patient placed on Ventilator (see vital sign flow sheet for setting)  Post-op Assessment: Report given to RN and Post -op Vital signs reviewed and stable  Post vital signs: Reviewed and stable  Last Vitals:  Vitals Value Taken Time  BP    Temp    Pulse 77 01/27/2019 10:09 AM  Resp 18 01/27/2019 10:09 AM  SpO2 98 % 01/27/2019 10:09 AM  Vitals shown include unvalidated device data.  Last Pain:  Vitals:   01/27/19 0800  TempSrc: Axillary  PainSc:          Complications: No apparent anesthesia complications

## 2019-01-27 NOTE — Progress Notes (Signed)
Patient transported on vent to OR without complications. 

## 2019-01-27 NOTE — Progress Notes (Signed)
Pre Procedure note for inpatients:   John Briggs has been scheduled for Procedure(s): Reopening of recent laparotomy, abdominal wound washout, partial omentectomy (N/A) Application Of Wound Vac today. The various methods of treatment have been discussed with the patient. After consideration of the risks, benefits and treatment options the patient has consented to the planned procedure.   The patient has been seen and labs reviewed. There are no changes in the patient's condition to prevent proceeding with the planned procedure today.  Recent labs:  Lab Results  Component Value Date   WBC 18.7 (H) 01/27/2019   HGB 10.1 (L) 01/27/2019   HCT 28.5 (L) 01/27/2019   PLT 111 (L) 01/27/2019   GLUCOSE 102 (H) 01/27/2019   TRIG 204 (H) 01/27/2019   ALT 27 01/23/2019   AST 55 (H) 01/23/2019   NA 139 01/27/2019   K 3.3 (L) 01/27/2019   CL 112 (H) 01/27/2019   CREATININE 1.62 (H) 01/27/2019   BUN 14 01/27/2019   CO2 19 (L) 01/27/2019   INR 1.3 (H) 01/24/2019    Rodman Pickle, MD 01/27/2019 7:29 AM

## 2019-01-27 NOTE — Progress Notes (Signed)
Assisted tele visit to patient with family member.  John Briggs P, RN  
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
## 2019-01-28 ENCOUNTER — Encounter (HOSPITAL_COMMUNITY): Payer: Self-pay | Admitting: General Surgery

## 2019-01-28 LAB — CBC
HCT: 29.6 % — ABNORMAL LOW (ref 39.0–52.0)
Hemoglobin: 10.5 g/dL — ABNORMAL LOW (ref 13.0–17.0)
MCH: 32.2 pg (ref 26.0–34.0)
MCHC: 35.5 g/dL (ref 30.0–36.0)
MCV: 90.8 fL (ref 80.0–100.0)
Platelets: 148 10*3/uL — ABNORMAL LOW (ref 150–400)
RBC: 3.26 MIL/uL — ABNORMAL LOW (ref 4.22–5.81)
RDW: 14.4 % (ref 11.5–15.5)
WBC: 16.4 10*3/uL — ABNORMAL HIGH (ref 4.0–10.5)
nRBC: 0.1 % (ref 0.0–0.2)

## 2019-01-28 LAB — BASIC METABOLIC PANEL
Anion gap: 13 (ref 5–15)
BUN: 11 mg/dL (ref 6–20)
CO2: 20 mmol/L — ABNORMAL LOW (ref 22–32)
Calcium: 7.8 mg/dL — ABNORMAL LOW (ref 8.9–10.3)
Chloride: 105 mmol/L (ref 98–111)
Creatinine, Ser: 1.49 mg/dL — ABNORMAL HIGH (ref 0.61–1.24)
GFR calc Af Amer: >60 mL/min (ref >60)
GFR calc non Af Amer: >60 mL/min (ref >60)
Glucose, Bld: 91 mg/dL (ref 70–99)
Potassium: 3.2 mmol/L — ABNORMAL LOW (ref 3.5–5.1)
Sodium: 138 mmol/L (ref 135–145)

## 2019-01-28 LAB — NOVEL CORONAVIRUS, NAA (HOSP ORDER, SEND-OUT TO REF LAB; TAT 18-24 HRS): SARS-CoV-2, NAA: NOT DETECTED

## 2019-01-28 MED ORDER — HYDROMORPHONE 1 MG/ML IV SOLN
INTRAVENOUS | Status: DC
  Administered 2019-01-28: 5 mg via INTRAVENOUS
  Administered 2019-01-28: 7 mg via INTRAVENOUS
  Administered 2019-01-28: 1.2 mg via INTRAVENOUS
  Administered 2019-01-28: 3 mg via INTRAVENOUS
  Administered 2019-01-28: 10:00:00 via INTRAVENOUS
  Administered 2019-01-29: 3 mg via INTRAVENOUS
  Administered 2019-01-29: 16:00:00 via INTRAVENOUS
  Administered 2019-01-29 (×2): 2.4 mg via INTRAVENOUS
  Administered 2019-01-29: 6.6 mg via INTRAVENOUS
  Administered 2019-01-30: 1.2 mg via INTRAVENOUS
  Administered 2019-01-30: 3 mg via INTRAVENOUS
  Administered 2019-01-30: 4 mg via INTRAVENOUS
  Administered 2019-01-30: 3.3 mg via INTRAVENOUS
  Filled 2019-01-28: qty 50
  Filled 2019-01-28: qty 25

## 2019-01-28 MED ORDER — DEXMEDETOMIDINE HCL IN NACL 400 MCG/100ML IV SOLN
0.4000 ug/kg/h | INTRAVENOUS | Status: DC
  Administered 2019-01-28 (×2): 1.2 ug/kg/h via INTRAVENOUS
  Administered 2019-01-28: 0.6 ug/kg/h via INTRAVENOUS
  Filled 2019-01-28 (×3): qty 100

## 2019-01-28 MED ORDER — DEXMEDETOMIDINE HCL IN NACL 200 MCG/50ML IV SOLN: 0.4000 ug/kg/h | INTRAVENOUS | Status: DC

## 2019-01-28 MED ORDER — DIPHENHYDRAMINE HCL 12.5 MG/5ML PO ELIX: 12.5000 mg | ORAL_SOLUTION | Freq: Four times a day (QID) | ORAL | Status: DC | PRN

## 2019-01-28 MED ORDER — ONDANSETRON HCL 4 MG/2ML IJ SOLN
4.0000 mg | Freq: Four times a day (QID) | INTRAMUSCULAR | Status: DC | PRN
  Administered 2019-02-02: 4 mg via INTRAVENOUS
  Filled 2019-01-28: qty 2

## 2019-01-28 MED ORDER — NALOXONE HCL 0.4 MG/ML IJ SOLN: 0.4000 mg | INTRAMUSCULAR | Status: DC | PRN

## 2019-01-28 MED ORDER — LORAZEPAM 2 MG/ML IJ SOLN
1.0000 mg | Freq: Four times a day (QID) | INTRAMUSCULAR | Status: DC | PRN
  Administered 2019-01-29: 2 mg via INTRAVENOUS
  Filled 2019-01-28 (×2): qty 1

## 2019-01-28 MED ORDER — DIPHENHYDRAMINE HCL 50 MG/ML IJ SOLN
12.5000 mg | Freq: Four times a day (QID) | INTRAMUSCULAR | Status: DC | PRN
  Administered 2019-02-02: 12.5 mg via INTRAVENOUS
  Filled 2019-01-28: qty 1

## 2019-01-28 MED ORDER — DEXMEDETOMIDINE HCL IN NACL 200 MCG/50ML IV SOLN: 0.2000 ug/kg/h | INTRAVENOUS | Status: DC

## 2019-01-28 MED ORDER — POTASSIUM CHLORIDE 10 MEQ/100ML IV SOLN
10.0000 meq | INTRAVENOUS | Status: AC
  Administered 2019-01-28 (×4): 10 meq via INTRAVENOUS
  Filled 2019-01-28 (×4): qty 100

## 2019-01-28 MED ORDER — SODIUM CHLORIDE 0.9% FLUSH
9.0000 mL | INTRAVENOUS | Status: DC | PRN
  Administered 2019-02-03: 9 mL via INTRAVENOUS
  Filled 2019-01-28: qty 9
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
## 2019-01-28 NOTE — Progress Notes (Signed)
Assisted tele visit to patient with family member.  Antonisha Waskey P, RN  

## 2019-01-29 LAB — BASIC METABOLIC PANEL
Anion gap: 19 — ABNORMAL HIGH (ref 5–15)
BUN: 10 mg/dL (ref 6–20)
CO2: 18 mmol/L — ABNORMAL LOW (ref 22–32)
Calcium: 8.2 mg/dL — ABNORMAL LOW (ref 8.9–10.3)
Chloride: 100 mmol/L (ref 98–111)
Creatinine, Ser: 1.42 mg/dL — ABNORMAL HIGH (ref 0.61–1.24)
GFR calc Af Amer: >60 mL/min (ref >60)
GFR calc non Af Amer: >60 mL/min (ref >60)
Glucose, Bld: 101 mg/dL — ABNORMAL HIGH (ref 70–99)
Potassium: 3.4 mmol/L — ABNORMAL LOW (ref 3.5–5.1)
Sodium: 137 mmol/L (ref 135–145)

## 2019-01-29 LAB — CBC
HCT: 28.3 % — ABNORMAL LOW (ref 39.0–52.0)
Hemoglobin: 10 g/dL — ABNORMAL LOW (ref 13.0–17.0)
MCH: 32.5 pg (ref 26.0–34.0)
MCHC: 35.3 g/dL (ref 30.0–36.0)
MCV: 91.9 fL (ref 80.0–100.0)
Platelets: 247 10*3/uL (ref 150–400)
RBC: 3.08 MIL/uL — ABNORMAL LOW (ref 4.22–5.81)
RDW: 14.2 % (ref 11.5–15.5)
WBC: 22.2 10*3/uL — ABNORMAL HIGH (ref 4.0–10.5)
nRBC: 0.1 % (ref 0.0–0.2)

## 2019-01-29 MED ORDER — PHENOL 1.4 % MT LIQD
1.0000 | OROMUCOSAL | Status: DC | PRN
  Administered 2019-02-01 (×2): 1 via OROMUCOSAL
  Filled 2019-01-29: qty 177

## 2019-01-29 MED ORDER — ESMOLOL HCL-SODIUM CHLORIDE 2000 MG/100ML IV SOLN
25.0000 ug/kg/min | INTRAVENOUS | Status: DC
  Administered 2019-01-29: 25 ug/kg/min via INTRAVENOUS
  Administered 2019-01-30: 30 ug/kg/min via INTRAVENOUS
  Filled 2019-01-29 (×2): qty 100

## 2019-01-29 MED ORDER — ACETAMINOPHEN 325 MG PO TABS
650.0000 mg | ORAL_TABLET | Freq: Four times a day (QID) | ORAL | Status: DC | PRN
  Administered 2019-01-29 – 2019-02-04 (×4): 650 mg via ORAL
  Filled 2019-01-29 (×4): qty 2

## 2019-01-29 MED ORDER — METOPROLOL TARTRATE 5 MG/5ML IV SOLN
5.0000 mg | Freq: Four times a day (QID) | INTRAVENOUS | Status: DC | PRN
  Filled 2019-01-29: qty 5

## 2019-01-29 MED ORDER — PIPERACILLIN-TAZOBACTAM 3.375 G IVPB
3.3750 g | Freq: Three times a day (TID) | INTRAVENOUS | Status: DC
  Administered 2019-01-29 – 2019-02-11 (×39): 3.375 g via INTRAVENOUS
  Filled 2019-01-29 (×41): qty 50

## 2019-01-29 MED ORDER — HYDROMORPHONE HCL 1 MG/ML IJ SOLN
0.5000 mg | INTRAMUSCULAR | Status: DC | PRN
  Administered 2019-01-30 – 2019-02-04 (×20): 1 mg via INTRAVENOUS
  Filled 2019-01-29 (×21): qty 1

## 2019-01-29 NOTE — Progress Notes (Signed)
   01/29/19 1223  Clinical Encounter Type  Visited With Patient  Visit Type Initial;Other (Comment) (AD)  Referral From Social work  Consult/Referral To Chaplain  This chaplain responded to SW-Jessie request for Pt. AD.  The chaplain introduced herself and began education on the role of HCPOA and the Living Will if the Pt. could not make decisions for himself.   The chaplain observed the Pt. willingness to be present with the chaplain during AD education.  The chaplain was not able to determine the level of the Pt.'s  understanding of an AD. The Pt. was able to identify Joni Reining as the Pt.'s relative. The Pt. gave the chaplain permission to phone Joni Reining on the progress of the AD education. The chaplain will update Joni Reining on the visit.  The Pt. and chaplain prayed together.  This chaplain will F/U with spiritual care as needed.
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
## 2019-01-29 NOTE — Progress Notes (Signed)
Assisted tele visit to patient with girlfriend.   Klee Kolek P, RN   

## 2019-01-29 NOTE — Progress Notes (Signed)
Assisted tele visit to patient with family member.  Esco Joslyn Ferrer, RN  
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
## 2019-01-29 NOTE — Progress Notes (Signed)
The chaplain phoned Pt. sister Joni Reining [phone])  with an update on Pt. Advance Directive progress.  Joni Reining explained to the the chaplain the document will need to be simplified for the Pt. to understand. The chaplain will update Joni Reining on the Pt. progress.
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
## 2019-01-30 ENCOUNTER — Inpatient Hospital Stay (HOSPITAL_COMMUNITY): Payer: No Typology Code available for payment source

## 2019-01-30 LAB — CBC
HCT: 31.1 % — ABNORMAL LOW (ref 39.0–52.0)
Hemoglobin: 10.8 g/dL — ABNORMAL LOW (ref 13.0–17.0)
MCH: 32.7 pg (ref 26.0–34.0)
MCHC: 34.7 g/dL (ref 30.0–36.0)
MCV: 94.2 fL (ref 80.0–100.0)
Platelets: 380 10*3/uL (ref 150–400)
RBC: 3.3 MIL/uL — ABNORMAL LOW (ref 4.22–5.81)
RDW: 14.6 % (ref 11.5–15.5)
WBC: 28.8 10*3/uL — ABNORMAL HIGH (ref 4.0–10.5)
nRBC: 0 % (ref 0.0–0.2)

## 2019-01-30 LAB — BASIC METABOLIC PANEL
Anion gap: 16 — ABNORMAL HIGH (ref 5–15)
Anion gap: 13 (ref 5–15)
BUN: 9 mg/dL (ref 6–20)
BUN: 11 mg/dL (ref 6–20)
CO2: 23 mmol/L (ref 22–32)
CO2: 21 mmol/L — ABNORMAL LOW (ref 22–32)
Calcium: 8.3 mg/dL — ABNORMAL LOW (ref 8.9–10.3)
Calcium: 8.5 mg/dL — ABNORMAL LOW (ref 8.9–10.3)
Chloride: 102 mmol/L (ref 98–111)
Chloride: 108 mmol/L (ref 98–111)
Creatinine, Ser: 1.33 mg/dL — ABNORMAL HIGH (ref 0.61–1.24)
Creatinine, Ser: 1.18 mg/dL (ref 0.61–1.24)
GFR calc Af Amer: >60 mL/min (ref >60)
GFR calc Af Amer: >60 mL/min (ref >60)
GFR calc non Af Amer: >60 mL/min (ref >60)
GFR calc non Af Amer: >60 mL/min (ref >60)
Glucose, Bld: 116 mg/dL — ABNORMAL HIGH (ref 70–99)
Glucose, Bld: 118 mg/dL — ABNORMAL HIGH (ref 70–99)
Potassium: 2.9 mmol/L — ABNORMAL LOW (ref 3.5–5.1)
Potassium: 3.7 mmol/L (ref 3.5–5.1)
Sodium: 141 mmol/L (ref 135–145)
Sodium: 142 mmol/L (ref 135–145)

## 2019-01-30 LAB — URINALYSIS, ROUTINE W REFLEX MICROSCOPIC
Bilirubin Urine: NEGATIVE
Glucose, UA: 50 mg/dL — AB
Ketones, ur: 5 mg/dL — AB
Leukocytes,Ua: NEGATIVE
Nitrite: NEGATIVE
Protein, ur: 100 mg/dL — AB
RBC / HPF: >50 RBC/hpf — ABNORMAL HIGH (ref 0–5)
Specific Gravity, Urine: 1.033 — ABNORMAL HIGH (ref 1.005–1.030)
WBC, UA: >50 WBC/hpf — ABNORMAL HIGH (ref 0–5)
pH: 5 (ref 5.0–8.0)

## 2019-01-30 LAB — MAGNESIUM: Magnesium: 1.6 mg/dL — ABNORMAL LOW (ref 1.7–2.4)

## 2019-01-30 MED ORDER — LORAZEPAM 2 MG/ML IJ SOLN
1.0000 mg | Freq: Four times a day (QID) | INTRAMUSCULAR | Status: DC | PRN
  Administered 2019-01-30 – 2019-02-02 (×2): 1 mg via INTRAVENOUS
  Filled 2019-01-30 (×3): qty 1

## 2019-01-30 MED ORDER — DIPHENHYDRAMINE HCL 50 MG/ML IJ SOLN: 12.5000 mg | Freq: Four times a day (QID) | INTRAMUSCULAR | Status: DC | PRN

## 2019-01-30 MED ORDER — DIPHENHYDRAMINE HCL 12.5 MG/5ML PO ELIX: 12.5000 mg | ORAL_SOLUTION | Freq: Four times a day (QID) | ORAL | Status: DC | PRN

## 2019-01-30 MED ORDER — MAGNESIUM SULFATE IN D5W 1-5 GM/100ML-% IV SOLN
1.0000 g | Freq: Once | INTRAVENOUS | Status: AC
  Administered 2019-01-30: 1 g via INTRAVENOUS
  Filled 2019-01-30: qty 100

## 2019-01-30 MED ORDER — LABETALOL HCL 5 MG/ML IV SOLN
10.0000 mg | Freq: Once | INTRAVENOUS | Status: AC
  Administered 2019-01-30: 10 mg via INTRAVENOUS
  Filled 2019-01-30: qty 4

## 2019-01-30 MED ORDER — HYDROMORPHONE 1 MG/ML IV SOLN: INTRAVENOUS | Status: DC

## 2019-01-30 MED ORDER — SODIUM CHLORIDE 0.9% FLUSH: 9.0000 mL | INTRAVENOUS | Status: DC | PRN

## 2019-01-30 MED ORDER — NALOXONE HCL 0.4 MG/ML IJ SOLN: 0.4000 mg | INTRAMUSCULAR | Status: DC | PRN

## 2019-01-30 MED ORDER — MAGNESIUM SULFATE 50 % IJ SOLN: 1.0000 g | Freq: Once | INTRAMUSCULAR | Status: DC

## 2019-01-30 MED ORDER — ONDANSETRON HCL 4 MG/2ML IJ SOLN: 4.0000 mg | Freq: Four times a day (QID) | INTRAMUSCULAR | Status: DC | PRN

## 2019-01-30 MED ORDER — MORPHINE SULFATE (PF) 2 MG/ML IV SOLN: 1.0000 mg | INTRAVENOUS | Status: DC | PRN

## 2019-01-30 MED ORDER — MORPHINE SULFATE (PF) 2 MG/ML IV SOLN
1.0000 mg | INTRAVENOUS | Status: DC | PRN
  Administered 2019-01-31: 2 mg via INTRAVENOUS
  Filled 2019-01-30: qty 1

## 2019-01-30 MED ORDER — POTASSIUM CHLORIDE 10 MEQ/100ML IV SOLN
10.0000 meq | INTRAVENOUS | Status: AC
  Administered 2019-01-30 (×6): 10 meq via INTRAVENOUS
  Filled 2019-01-30 (×6): qty 100

## 2019-01-30 MED ORDER — DEXMEDETOMIDINE HCL IN NACL 200 MCG/50ML IV SOLN
0.4000 ug/kg/h | INTRAVENOUS | Status: DC
  Administered 2019-01-30: 0.6 ug/kg/h via INTRAVENOUS
  Administered 2019-01-30: 0.5 ug/kg/h via INTRAVENOUS
  Administered 2019-01-30: 0.7 ug/kg/h via INTRAVENOUS
  Administered 2019-01-31: 0.5 ug/kg/h via INTRAVENOUS
  Administered 2019-01-31: 0.2 ug/kg/h via INTRAVENOUS
  Administered 2019-01-31 – 2019-02-02 (×7): 0.5 ug/kg/h via INTRAVENOUS
  Filled 2019-01-30 (×5): qty 50
  Filled 2019-01-30 (×3): qty 100
  Filled 2019-01-30 (×2): qty 50
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
## 2019-01-30 NOTE — Progress Notes (Signed)
Assisted tele visit to patient with partner.  Willmer Fellers Anne, RN   
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
## 2019-01-31 ENCOUNTER — Inpatient Hospital Stay (HOSPITAL_COMMUNITY): Payer: No Typology Code available for payment source

## 2019-01-31 LAB — BASIC METABOLIC PANEL
Anion gap: 10 (ref 5–15)
BUN: 13 mg/dL (ref 6–20)
CO2: 22 mmol/L (ref 22–32)
Calcium: 8.5 mg/dL — ABNORMAL LOW (ref 8.9–10.3)
Chloride: 108 mmol/L (ref 98–111)
Creatinine, Ser: 1.2 mg/dL (ref 0.61–1.24)
GFR calc Af Amer: >60 mL/min (ref >60)
GFR calc non Af Amer: >60 mL/min (ref >60)
Glucose, Bld: 115 mg/dL — ABNORMAL HIGH (ref 70–99)
Potassium: 3.6 mmol/L (ref 3.5–5.1)
Sodium: 140 mmol/L (ref 135–145)

## 2019-01-31 LAB — CBC
HCT: 28.5 % — ABNORMAL LOW (ref 39.0–52.0)
Hemoglobin: 9.8 g/dL — ABNORMAL LOW (ref 13.0–17.0)
MCH: 31.9 pg (ref 26.0–34.0)
MCHC: 34.4 g/dL (ref 30.0–36.0)
MCV: 92.8 fL (ref 80.0–100.0)
Platelets: 481 10*3/uL — ABNORMAL HIGH (ref 150–400)
RBC: 3.07 MIL/uL — ABNORMAL LOW (ref 4.22–5.81)
RDW: 14.4 % (ref 11.5–15.5)
WBC: 34.1 10*3/uL — ABNORMAL HIGH (ref 4.0–10.5)
nRBC: 0.1 % (ref 0.0–0.2)

## 2019-01-31 LAB — MAGNESIUM: Magnesium: 1.9 mg/dL (ref 1.7–2.4)

## 2019-01-31 MED ORDER — OXYCODONE HCL 5 MG PO TABS
5.0000 mg | ORAL_TABLET | ORAL | Status: DC | PRN
  Administered 2019-01-31 – 2019-02-04 (×15): 5 mg via ORAL
  Filled 2019-01-31 (×16): qty 1

## 2019-01-31 MED ORDER — DOCUSATE SODIUM 100 MG PO CAPS
100.0000 mg | ORAL_CAPSULE | Freq: Two times a day (BID) | ORAL | Status: DC
  Administered 2019-01-31 – 2019-02-11 (×21): 100 mg via ORAL
  Filled 2019-01-31 (×22): qty 1

## 2019-01-31 MED ORDER — SODIUM CHLORIDE 0.45 % IV BOLUS
500.0000 mL | Freq: Once | INTRAVENOUS | Status: AC
  Administered 2019-01-31: 500 mL via INTRAVENOUS

## 2019-01-31 MED ORDER — HEPARIN SODIUM (PORCINE) 5000 UNIT/ML IJ SOLN
5000.0000 [IU] | Freq: Three times a day (TID) | INTRAMUSCULAR | Status: DC
  Administered 2019-02-02: 5000 [IU] via SUBCUTANEOUS
  Filled 2019-01-31: qty 1

## 2019-01-31 MED ORDER — IOHEXOL 300 MG/ML  SOLN
100.0000 mL | Freq: Once | INTRAMUSCULAR | Status: AC | PRN
  Administered 2019-01-31: 100 mL via INTRAVENOUS
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
## 2019-01-31 NOTE — Progress Notes (Signed)
Patient ID: John Briggs, male   DOB: 1986-04-19, 33 y.o.   MRN: [id number] Discussed case and plan with Angelina Sheriff

## 2019-01-31 NOTE — Progress Notes (Signed)
At 2024 Patient family member Angelina Sheriff called to obtain patient information. The patient consented to sharing medical information with the family member and the family member provided the correct password.  During the phone conversation with the family member she expressed that she had recently talked with the patient on his personal phone, and that he was concerned with the results of a recent CT scan. The family member then requested to know the results/findings of the recent Abdominal/pelvic CT scan (01/31/2019 @ 1550). The family member was informed that explaining results and findings were not within the Nursing scope of practice, but that I (the Nurse) would be happy to connect her with a physician who could give the results of the scan.  The family member was displeased with this response and questioned the nurses ability to care for the patient. The family member would no longer listen to anything the nurse had to say and demanded to speak with the charge nurse.   After handing the phone over to the charge nurse the patients family member claimed to the charge nurse that I refused to give any information to her and was rude and disrespectful.  After attempting to calm the patient down with therapeutic communication techniques, legal scope of practice explanations, and assurance of the patients safety. The family member was still furiously displeased and demanded to speak with the Water quality scientist, Chiropodist, or A/C.   - At this time the patient was transferred by the charge nurse-   Following this incident, I then went into the patients room to assess the patients emotional and cultural wellbeing concerning the CT scan and any other procedure or policy that may have been miscommunicated. The patient (of sound mind/ Alert and Oriented x4) denied any lack of understanding of his situation. The patient did communicate some anxiety and fear, in which I was able to talk with the patient assess  the need for spiritual care, and give comfort.

## 2019-01-31 NOTE — Consult Note (Signed)
WOC Nurse wound follow up Wound type:Midline abdominal wound.  Applied NPWT (VAC) dressing Wednesday.  Ostomy is now producing soft green stool. NG tube also in place.  Today, proximal end of wound has pale protruding piece of tissue present that has extended through sutures into wound bed.  CCS surgeon notified and arrives at bedside.  Had planned to obtain CT scan of abdomen and we will place NS moist gauze dressing until results are obtained.  NPWT will be held for now.  Measurement: 13 cm x 1.2 cm x 1 cm  Wound MCN:[id number] tissue after NPWT removal at proximal end, consistent with omentum.  CT scan to be obtained.  Drainage (amount, consistency, odor) Minimal serosanguinous in canister.   Periwound:inact  LLQ colostomy Dressing procedure/placement/frequency: Cleanse midline wound with NS and pat dry.  NS moist kerlix to wound bed.  Cover with ABD pad and tape.  Change each shift.  WOC Nurse ostomy consult note Stoma type/location: LLQ loop colostomy Stomal assessment/size: 2" edematous, well budded. Cut off center to accommodate midline incision.  Peristomal assessment: Medical adhesive related skin damage at 7 o'clock from skin barrier.  This area is resolving.  Treatment options for stomal/peristomal skin: barrier ring due to close proximity of NPWT dressing.  Output  Soft green stool.  Ostomy pouching: 2pc. 2 3/4" with barrier ring  Education provided: Patient is minimally participative today but asking questions.  Bowel function has returned.  Enrolled patient in [employer] DC program: No WOC team will follow.   Maple Hudson MSN, RN, FNP-BC CWON Wound, Ostomy, Continence Nurse Pager [phone]

## 2019-01-31 NOTE — Progress Notes (Addendum)
4 Days Post-Op   Subjective/Chief Complaint: Alert, awake, complains of sore throat but denies abdominal pain   Objective: Vital signs in last 24 hours: Temp:  [97.9 F (36.6 C)-99.7 F (37.6 C)] 98.5 F (36.9 C) (05/08 0800) Pulse Rate:  [85-129] 88 (05/08 0600) Resp:  [11-29] 25 (05/08 0600) BP: (120-140)/(77-98) 135/94 (05/08 0600) SpO2:  [89 %-100 %] 94 % (05/08 0600) Last BM Date: (PTA)  Intake/Output from previous day: 05/07 0701 - 05/08 0700 In: 2069.7 [I.V.:1290.4; IV Piggyback:779.3] Out: 2315 [Urine:1025; Emesis/NG output:800; Drains:15; Stool:475] Intake/Output this shift: No intake/output data recorded.  Gen: alert, awake, follows commands HEENT: NG tube in place; output bilious Resp: unlabored Cardiovascular:regular, 80s Abdomen: Ostomy pink, edematous, stool and gas in bag; midline incision with a 2cm nodule of soft tissue (?omentum with a silk tie on it?) protruding in the upper aspect of the wound; wound is healthy no evidence of infection or fistula, Appropriate tenderness. JP output serous Neuro: Awake, alert, oriented, MAE GU: foley in place, urine non-bloody  Lab Results:  Recent Labs    01/30/19 0340 01/31/19 0009  WBC 28.8* 34.1*  HGB 10.8* 9.8*  HCT 31.1* 28.5*  PLT 380 481*   BMET Recent Labs    01/30/19 1753 01/31/19 0009  NA 142 140  K 3.7 3.6  CL 108 108  CO2 21* 22  GLUCOSE 118* 115*  BUN 11 13  CREATININE 1.18 1.20  CALCIUM 8.5* 8.5*   PT/INR No results for input(s): LABPROT, INR in the last 72 hours. ABG No results for input(s): PHART, HCO3 in the last 72 hours.  Invalid input(s): PCO2, PO2  Studies/Results: Dg Chest Port 1 View  Result Date: 01/30/2019 CLINICAL DATA:  Shortness of breath EXAM: PORTABLE CHEST 1 VIEW COMPARISON:  01/25/2019 FINDINGS: Interval removal of endotracheal tube. Esophagogastric tube remains positioned with tip and side port below the diaphragm. There has been slight interval increase in a small  left pleural effusion with associated atelectasis or consolidation. There may be a small right pleural effusion. No significant airspace opacity. Heart and mediastinum are unremarkable. IMPRESSION: Interval removal of endotracheal tube. Esophagogastric tube remains positioned with tip and side port below the diaphragm. There has been slight interval increase in a small left pleural effusion with associated atelectasis or consolidation. There may be a small right pleural effusion. No significant airspace opacity. Heart and mediastinum are unremarkable. Electronically Signed   By: Lauralyn Primes M.D.   On: 01/30/2019 08:52    Anti-infectives: Anti-infectives (From admission, onward)   Start     Dose/Rate Route Frequency Ordered Stop   01/29/19 1000  piperacillin-tazobactam (ZOSYN) IVPB 3.375 g     3.375 g 12.5 mL/hr over 240 Minutes Intravenous Every 8 hours 01/29/19 0931     01/27/19 0830  piperacillin-tazobactam (ZOSYN) IVPB 3.375 g     3.375 g 100 mL/hr over 30 Minutes Intravenous To Surgery 01/27/19 6734 01/27/19 0855   01/25/19 0815  piperacillin-tazobactam (ZOSYN) IVPB 3.375 g     3.375 g 100 mL/hr over 30 Minutes Intravenous  Once 01/25/19 0814 01/25/19 1233   01/24/19 0145  cefoTEtan (CEFOTAN) 2 g in sodium chloride 0.9 % 100 mL IVPB     2 g 200 mL/hr over 30 Minutes Intravenous  Once 01/24/19 0137 01/24/19 0351      Assessment/Plan: S/P Ex lap with liver repair, gastrorrhaphy, transverse colectomy, proximal small bowel repair, mid-jejunal small bowel resection, bladder repair and left ureteral stent placement, abthera placement Janee Morn and [place]) -  01/24/19 01/25/19 - Abdominal washout/ VAC change - Dr. Cliffton AstersWhite 01/27/19  abdominal closure and colostomy - Dr. Sheliah HatchKinsinger ABLA-Hgb stable FEN-NPO, NG tube, hypokalemia - improved today VTE-SQH ppx, Cr1.2 ID-WBC up again today, no obvious source, CXR yesterday with increasing L effusion with atelectasis or consolidation, now on zosyn  empirically from 5/6, Plan CT today. Will give fluid bolus around IV contrast given recent AKI Neuro-  CIWA protocol Dispo-ICUfor etOH precautions at least one more day   Critical Care Total Time*: 30 Minutes  Vianney Kopecky A Fredricka BonineConnor 01/31/2019

## 2019-02-01 ENCOUNTER — Encounter (HOSPITAL_COMMUNITY): Payer: Self-pay | Admitting: Interventional Radiology

## 2019-02-01 ENCOUNTER — Inpatient Hospital Stay (HOSPITAL_COMMUNITY): Payer: No Typology Code available for payment source

## 2019-02-01 HISTORY — PX: IR GUIDED DRAIN W CATHETER PLACEMENT: IMG719

## 2019-02-01 LAB — BASIC METABOLIC PANEL
Anion gap: 10 (ref 5–15)
BUN: 16 mg/dL (ref 6–20)
CO2: 23 mmol/L (ref 22–32)
Calcium: 8.1 mg/dL — ABNORMAL LOW (ref 8.9–10.3)
Chloride: 105 mmol/L (ref 98–111)
Creatinine, Ser: 1.19 mg/dL (ref 0.61–1.24)
GFR calc Af Amer: >60 mL/min (ref >60)
GFR calc non Af Amer: >60 mL/min (ref >60)
Glucose, Bld: 101 mg/dL — ABNORMAL HIGH (ref 70–99)
Potassium: 3.3 mmol/L — ABNORMAL LOW (ref 3.5–5.1)
Sodium: 138 mmol/L (ref 135–145)

## 2019-02-01 LAB — CBC
HCT: 28.3 % — ABNORMAL LOW (ref 39.0–52.0)
Hemoglobin: 9.6 g/dL — ABNORMAL LOW (ref 13.0–17.0)
MCH: 32 pg (ref 26.0–34.0)
MCHC: 33.9 g/dL (ref 30.0–36.0)
MCV: 94.3 fL (ref 80.0–100.0)
Platelets: 606 10*3/uL — ABNORMAL HIGH (ref 150–400)
RBC: 3 MIL/uL — ABNORMAL LOW (ref 4.22–5.81)
RDW: 14.5 % (ref 11.5–15.5)
WBC: 42 10*3/uL — ABNORMAL HIGH (ref 4.0–10.5)
nRBC: 0.1 % (ref 0.0–0.2)

## 2019-02-01 LAB — MAGNESIUM: Magnesium: 1.6 mg/dL — ABNORMAL LOW (ref 1.7–2.4)

## 2019-02-01 MED ORDER — LIDOCAINE HCL 1 % IJ SOLN
INTRAMUSCULAR | Status: AC
  Filled 2019-02-01: qty 20

## 2019-02-01 MED ORDER — SODIUM CHLORIDE 0.9% FLUSH
5.0000 mL | Freq: Three times a day (TID) | INTRAVENOUS | Status: DC
  Administered 2019-02-01 – 2019-02-11 (×13): 5 mL

## 2019-02-01 MED ORDER — FENTANYL CITRATE (PF) 100 MCG/2ML IJ SOLN
INTRAMUSCULAR | Status: AC | PRN
  Administered 2019-02-01 (×3): 25 ug via INTRAVENOUS

## 2019-02-01 MED ORDER — MIDAZOLAM HCL 2 MG/2ML IJ SOLN
INTRAMUSCULAR | Status: AC
  Filled 2019-02-01: qty 2

## 2019-02-01 MED ORDER — FENTANYL CITRATE (PF) 100 MCG/2ML IJ SOLN
INTRAMUSCULAR | Status: AC
  Filled 2019-02-01: qty 2

## 2019-02-01 MED ORDER — MIDAZOLAM HCL 2 MG/2ML IJ SOLN
INTRAMUSCULAR | Status: AC | PRN
  Administered 2019-02-01: 0.5 mg via INTRAVENOUS
  Administered 2019-02-01: 1 mg via INTRAVENOUS

## 2019-02-01 MED ORDER — LIDOCAINE HCL (PF) 1 % IJ SOLN
INTRAMUSCULAR | Status: AC | PRN
  Administered 2019-02-01: 10 mL
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
## 2019-02-01 NOTE — Progress Notes (Signed)
5 Days Post-Op   Subjective/Chief Complaint: Abdominal pain Patient awake alert in good spirits.  Complains of an intermittent abdominal pain.  Biloma noted and to undergo IR drain placement today   Objective: Vital signs in last 24 hours: Temp:  [98.6 F (37 C)-99.8 F (37.7 C)] 98.8 F (37.1 C) (05/09 0800) Pulse Rate:  [77-102] 89 (05/09 0900) Resp:  [19-31] 22 (05/09 0900) BP: (114-150)/(78-100) 117/81 (05/09 0900) SpO2:  [86 %-98 %] 94 % (05/09 0900) Last BM Date: (PTA)  Intake/Output from previous day: 05/08 0701 - 05/09 0700 In: 2072.3 [I.V.:1378.2; NG/GT:550; IV Piggyback:144.1] Out: 1885 [Urine:1275; Emesis/NG output:150; Drains:10; Stool:450] Intake/Output this shift: Total I/O In: 438.3 [I.V.:275.8; IV Piggyback:162.5] Out: -   General appearance: alert and cooperative Resp: clear to auscultation bilaterally Cardio: regular rate and rhythm, S1, S2 normal, no murmur, click, rub or gallop Incision/Wound: Clean dry intact.  Colostomy pink and functioning well  Lab Results:  Recent Labs    01/31/19 0009 02/01/19 0420  WBC 34.1* 42.0*  HGB 9.8* 9.6*  HCT 28.5* 28.3*  PLT 481* 606*   BMET Recent Labs    01/31/19 0009 02/01/19 0420  NA 140 138  K 3.6 3.3*  CL 108 105  CO2 22 23  GLUCOSE 115* 101*  BUN 13 16  CREATININE 1.20 1.19  CALCIUM 8.5* 8.1*   PT/INR No results for input(s): LABPROT, INR in the last 72 hours. ABG No results for input(s): PHART, HCO3 in the last 72 hours.  Invalid input(s): PCO2, PO2  Studies/Results: Ct Abdomen Pelvis W Contrast  Result Date: 01/31/2019 CLINICAL DATA:  Abdominal pain, fever. EXAM: CT ABDOMEN AND PELVIS WITH CONTRAST TECHNIQUE: Multidetector CT imaging of the abdomen and pelvis was performed using the standard protocol following bolus administration of intravenous contrast. CONTRAST:  100mL OMNIPAQUE IOHEXOL 300 MG/ML  SOLN COMPARISON:  None. FINDINGS: Lower chest: Visualized right lung base is unremarkable.  Left pleural effusion is noted with associated atelectasis or infiltrate of the left lower lobe. Hepatobiliary: No gallstones or biliary dilatation is noted. Subcapsular fluid collection is seen involving the left hepatic lobe which measures 10.7 x 5.5 cm and is concerning for subcapsular abscess or hematoma. Pancreas: Unremarkable. No pancreatic ductal dilatation or surrounding inflammatory changes. Spleen: Normal in size without focal abnormality. Adrenals/Urinary Tract: Adrenal glands appear normal. Left ureteral stent is noted in grossly good position. Right kidney and ureter are unremarkable. Urinary bladder is decompressed secondary to Foley catheter. Stomach/Bowel: Nasogastric tube is seen in distal stomach. Colostomy is noted in left side of abdominal wall. Mildly dilated small bowel loops are noted which may represent postoperative ileus. Vascular/Lymphatic: No significant vascular findings are present. No enlarged abdominal or pelvic lymph nodes. Reproductive: Prostate is unremarkable. Other: No ascites is noted.  Surgical drain is noted in the pelvis. Musculoskeletal: No acute or significant osseous findings. IMPRESSION: 10.7 x 5.5 x 5.8 cm subcapsular fluid collection is seen overlying the superior and anterior aspect of the left hepatic lobe consistent with subcapsular abscess or hematoma. These results will be called to the ordering clinician or representative by the Radiologist Assistant, and communication documented in the PACS or zVision Dashboard. Left pleural effusion is noted with associated atelectasis or infiltrate of the left lower lobe. Left ureteral stent is in grossly good position. No hydronephrosis is noted. Colostomy seen in left anterior abdominal wall. Mildly dilated small bowel loops are noted which may represent postoperative ileus. Electronically Signed   By: Zenda AlpersJames  Green Jr M.D.  On: 01/31/2019 16:05    Anti-infectives: Anti-infectives (From admission, onward)   Start      Dose/Rate Route Frequency Ordered Stop   01/29/19 1000  piperacillin-tazobactam (ZOSYN) IVPB 3.375 g     3.375 g 12.5 mL/hr over 240 Minutes Intravenous Every 8 hours 01/29/19 0931     01/27/19 0830  piperacillin-tazobactam (ZOSYN) IVPB 3.375 g     3.375 g 100 mL/hr over 30 Minutes Intravenous To Surgery 01/27/19 6067 01/27/19 0855   01/25/19 0815  piperacillin-tazobactam (ZOSYN) IVPB 3.375 g     3.375 g 100 mL/hr over 30 Minutes Intravenous  Once 01/25/19 0814 01/25/19 1233   01/24/19 0145  cefoTEtan (CEFOTAN) 2 g in sodium chloride 0.9 % 100 mL IVPB     2 g 200 mL/hr over 30 Minutes Intravenous  Once 01/24/19 0137 01/24/19 0351      Assessment/Plan: s/p Procedure(s): EXPLORATORY LAPAROTOMY (N/A) Colostomy Application Of Wound Vac    S/P Ex lap with liver repair, gastrorrhaphy, transverse colectomy, proximal small bowel repair, mid-jejunal small bowel resection, bladder repair and left ureteral stent placement, abthera placement Janee Morn and [place])- 01/24/19 01/25/19 - Abdominal washout/ VAC change - Dr. Cliffton Asters 5/4/20abdominal closure and colostomy - Dr. Sheliah Hatch ABLA-Hgb stable FEN-NPO, NG tube, hypokalemia -replace VTE-SQH ppx, Cr1.2 ID-WBC up again today, no obvious source, CXR yesterday with increasing L effusion with atelectasis or consolidation, now on zosyn empirically from 5/6,  Biloma noted on CT scan from yesterday.  IR seeing and will plan on percutaneous drainage today.  Neuro-  CIWA protocol Dispo-ICUfor etOH precautionsat least one more day  LOS: 8 days   30 minutes critical care time Maisie Fus A Shawon Denzer 02/01/2019

## 2019-02-01 NOTE — Plan of Care (Signed)
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
## 2019-02-01 NOTE — Procedures (Signed)
LUQ SUBCAPSULAR BILOMA  S/P [place] DRAIN BILOUS FLD ASPIRATED   No comp Stable ebl min cx sent Full report in pacs

## 2019-02-02 LAB — CBC
HCT: 28 % — ABNORMAL LOW (ref 39.0–52.0)
Hemoglobin: 9.7 g/dL — ABNORMAL LOW (ref 13.0–17.0)
MCH: 32.4 pg (ref 26.0–34.0)
MCHC: 34.6 g/dL (ref 30.0–36.0)
MCV: 93.6 fL (ref 80.0–100.0)
Platelets: 655 10*3/uL — ABNORMAL HIGH (ref 150–400)
RBC: 2.99 MIL/uL — ABNORMAL LOW (ref 4.22–5.81)
RDW: 14.2 % (ref 11.5–15.5)
WBC: 36 10*3/uL — ABNORMAL HIGH (ref 4.0–10.5)
nRBC: 0.1 % (ref 0.0–0.2)

## 2019-02-02 LAB — COMPREHENSIVE METABOLIC PANEL
ALT: 23 U/L (ref 0–44)
AST: 27 U/L (ref 15–41)
Albumin: 1.8 g/dL — ABNORMAL LOW (ref 3.5–5.0)
Alkaline Phosphatase: 144 U/L — ABNORMAL HIGH (ref 38–126)
Anion gap: 11 (ref 5–15)
BUN: 12 mg/dL (ref 6–20)
CO2: 21 mmol/L — ABNORMAL LOW (ref 22–32)
Calcium: 8.3 mg/dL — ABNORMAL LOW (ref 8.9–10.3)
Chloride: 106 mmol/L (ref 98–111)
Creatinine, Ser: 1.08 mg/dL (ref 0.61–1.24)
GFR calc Af Amer: >60 mL/min (ref >60)
GFR calc non Af Amer: >60 mL/min (ref >60)
Glucose, Bld: 111 mg/dL — ABNORMAL HIGH (ref 70–99)
Potassium: 3.2 mmol/L — ABNORMAL LOW (ref 3.5–5.1)
Sodium: 138 mmol/L (ref 135–145)
Total Bilirubin: 1 mg/dL (ref 0.3–1.2)
Total Protein: 6.6 g/dL (ref 6.5–8.1)

## 2019-02-02 MED ORDER — POTASSIUM CHLORIDE 10 MEQ/100ML IV SOLN
10.0000 meq | INTRAVENOUS | Status: AC
  Administered 2019-02-02 (×4): 10 meq via INTRAVENOUS
  Filled 2019-02-02 (×2): qty 100

## 2019-02-02 MED ORDER — LORAZEPAM 2 MG/ML IJ SOLN
2.0000 mg | INTRAMUSCULAR | Status: DC | PRN
  Administered 2019-02-02: 2 mg via INTRAVENOUS
  Filled 2019-02-02: qty 1

## 2019-02-02 MED ORDER — ENOXAPARIN SODIUM 40 MG/0.4ML ~~LOC~~ SOLN
40.0000 mg | SUBCUTANEOUS | Status: DC
  Administered 2019-02-02 – 2019-02-11 (×9): 40 mg via SUBCUTANEOUS
  Filled 2019-02-02 (×9): qty 0.4

## 2019-02-02 NOTE — Progress Notes (Signed)
Transferred pt to [facility] with no incident with his belongings with included a cell phone, charger, and rings in a pink denture cup.  Erick Blinks, RN

## 2019-02-02 NOTE — Progress Notes (Addendum)
6 Days Post-Op   Subjective/Chief Complaint: Awake alert doing well, feels better after drain    Objective: Vital signs in last 24 hours: Temp:  [97.7 F (36.5 C)-99.7 F (37.6 C)] 98.7 F (37.1 C) (05/10 0800) Pulse Rate:  [71-93] 71 (05/10 0800) Resp:  [16-35] 17 (05/10 0800) BP: (119-138)/(78-93) 127/87 (05/10 0800) SpO2:  [89 %-99 %] 99 % (05/10 0800) Last BM Date: 02/01/19  Intake/Output from previous day: 05/09 0701 - 05/10 0700 In: 2549.3 [I.V.:2092.2; IV Piggyback:457.1] Out: 1100 [Urine:800; Drains:300] Intake/Output this shift: No intake/output data recorded.   General appearance: alert and cooperative Resp: clear to auscultation bilaterally Cardio: regular rate and rhythm, Incision/Wound: open without infection Colostomy pink and functioning well with some output, drain with some bilious fluid present  Lab Results:  Recent Labs    02/01/19 0420 02/02/19 0042  WBC 42.0* 36.0*  HGB 9.6* 9.7*  HCT 28.3* 28.0*  PLT 606* 655*   BMET Recent Labs    02/01/19 0420 02/02/19 0042  NA 138 138  K 3.3* 3.2*  CL 105 106  CO2 23 21*  GLUCOSE 101* 111*  BUN 16 12  CREATININE 1.19 1.08  CALCIUM 8.1* 8.3*   PT/INR No results for input(s): LABPROT, INR in the last 72 hours. ABG No results for input(s): PHART, HCO3 in the last 72 hours.  Invalid input(s): PCO2, PO2  Studies/Results: Ct Abdomen Pelvis W Contrast  Result Date: 01/31/2019 CLINICAL DATA:  Abdominal pain, fever. EXAM: CT ABDOMEN AND PELVIS WITH CONTRAST TECHNIQUE: Multidetector CT imaging of the abdomen and pelvis was performed using the standard protocol following bolus administration of intravenous contrast. CONTRAST:  OMNIPAQUE IOHEXOL 300 MG/ML  SOLN COMPARISON:  None. FINDINGS: Lower chest: Visualized right lung base is unremarkable. Left pleural effusion is noted with associated atelectasis or infiltrate of the left lower lobe. Hepatobiliary: No gallstones or biliary dilatation is noted.  Subcapsular fluid collection is seen involving the left hepatic lobe which measures 10.7 x 5.5 cm and is concerning for subcapsular abscess or hematoma. Pancreas: Unremarkable. No pancreatic ductal dilatation or surrounding inflammatory changes. Spleen: Normal in size without focal abnormality. Adrenals/Urinary Tract: Adrenal glands appear normal. Left ureteral stent is noted in grossly good position. Right kidney and ureter are unremarkable. Urinary bladder is decompressed secondary to Foley catheter. Stomach/Bowel: Nasogastric tube is seen in distal stomach. Colostomy is noted in left side of abdominal wall. Mildly dilated small bowel loops are noted which may represent postoperative ileus. Vascular/Lymphatic: No significant vascular findings are present. No enlarged abdominal or pelvic lymph nodes. Reproductive: Prostate is unremarkable. Other: No ascites is noted.  Surgical drain is noted in the pelvis. Musculoskeletal: No acute or significant osseous findings. IMPRESSION: 10.7 x 5.5 x 5.8 cm subcapsular fluid collection is seen overlying the superior and anterior aspect of the left hepatic lobe consistent with subcapsular abscess or hematoma. These results will be called to the ordering clinician or representative by the Radiologist Assistant, and communication documented in the PACS or zVision Dashboard. Left pleural effusion is noted with associated atelectasis or infiltrate of the left lower lobe. Left ureteral stent is in grossly good position. No hydronephrosis is noted. Colostomy seen in left anterior abdominal wall. Mildly dilated small bowel loops are noted which may represent postoperative ileus. Electronically Signed   By: Lupita Raider M.D.   On: 01/31/2019 16:05   Ir Guided Horace Porteous W Catheter Placement  Result Date: 02/01/2019 INDICATION: LEFT UPPER QUADRANT SUBCAPSULAR FLUID COLLECTION, SUSPECT BILOMA EXAM: ULTRASOUND  DRAIN OF THE LEFT UPPER QUADRANT SUBCAPSULAR HEPATIC COLLECTION MEDICATIONS:  The patient is currently admitted to the hospital and receiving intravenous antibiotics. The antibiotics were administered within an appropriate time frame prior to the initiation of the procedure. ANESTHESIA/SEDATION: Fentanyl 75 mcg IV; Versed 1.5 mg IV Moderate Sedation Time:  10 MINUTES The patient was continuously monitored during the procedure by the interventional radiology nurse under my direct supervision. COMPLICATIONS: None immediate. PROCEDURE: Informed written consent was obtained from the patient after a thorough discussion of the procedural risks, benefits and alternatives. All questions were addressed. Maximal Sterile Barrier Technique was utilized including caps, mask, sterile gowns, sterile gloves, sterile drape, hand hygiene and skin antiseptic. A timeout was performed prior to the initiation of the procedure. Previous imaging reviewed. Preliminary ultrasound performed. The left upper quadrant subcapsular hepatic collection was localized. Overlying skin marked for an anterior lower intercostal approach. Under sterile conditions and local anesthesia, an 18 gauge 10 cm access needle was advanced into the collection. Needle position confirmed with ultrasound. There was return of bilious fluid. Sample sent for culture. Guidewire inserted followed by 10 [place] drain. Drain catheter position confirmed with ultrasound. 60 cc of bilious fluid aspirated. Drain secured with a Prolene suture and connected to external suction bulb. Sterile dressing applied. No immediate complication. Patient tolerated the procedure well. IMPRESSION: Successful ultrasound left upper quadrant subcapsular hepatic biloma drain placement Electronically Signed   By: Judie PetitM.  Shick M.D.   On: 02/01/2019 10:41    Anti-infectives: Anti-infectives (From admission, onward)   Start     Dose/Rate Route Frequency Ordered Stop   01/29/19 1000  piperacillin-tazobactam (ZOSYN) IVPB 3.375 g     3.375 g 12.5 mL/hr over 240 Minutes  Intravenous Every 8 hours 01/29/19 0931     01/27/19 0830  piperacillin-tazobactam (ZOSYN) IVPB 3.375 g     3.375 g 100 mL/hr over 30 Minutes Intravenous To Surgery 01/27/19 [id number] 01/27/19 0855   01/25/19 0815  piperacillin-tazobactam (ZOSYN) IVPB 3.375 g     3.375 g 100 mL/hr over 30 Minutes Intravenous  Once 01/25/19 0814 01/25/19 1233   01/24/19 0145  cefoTEtan (CEFOTAN) 2 g in sodium chloride 0.9 % 100 mL IVPB     2 g 200 mL/hr over 30 Minutes Intravenous  Once 01/24/19 0137 01/24/19 0351      Assessment/Plan: S/P Ex lap with liver repair, gastrorrhaphy, transverse colectomy, proximal small bowel repair, mid-jejunal small bowel resection, bladder repair and left ureteral stent placement, abthera placement [name]- 01/24/19 01/25/19 - Abdominal washout/ VAC change - Dr. Cliffton AstersWhite 5/4/20abdominal closure and colostomy - Dr. Sheliah HatchKinsinger ABLA-Hgb stable FEN-NPO, will try to clamp tube today, has good bs, output less and not bilious, replace potassium and recheck in am VTE-cr normal , will use lovenox ID-biloma drained yesterday, wbc decreasing, cont zosyn Neuro- CIWA protocol, stop precedex this am Dispo-ICUfor etOH precautionsat least one more day I discussed case with Angelina SheriffNicole Lacy today  20 minutes critical care time  John LoronMatthew Brilyn Briggs 02/02/2019

## 2019-02-02 NOTE — Progress Notes (Signed)
Patient ID: John Briggs, male   DOB: 08-30-1986, 33 y.o.   MRN: [id number]  IR round note via phone call with nurse: Pt s/p LUQ biloma drain placement yesterday; pt alert but with cont intermittent abd discomfort, sl better after drain VSS; AF WBC 36(42), hgb 9.7(9.6), K 3.2, creat/t bili nl; cx pend; drain output 300 cc Cont current tx, output monitoring, check final cx's; once output minimal obtain f/u CT; replace K

## 2019-02-02 NOTE — Progress Notes (Signed)
Patient ID: John Briggs, male   DOB: 11-22-1985, 33 y.o.   MRN: [id number] Ng out, off precedex, doing well, will tx to [facility]

## 2019-02-03 LAB — CBC
HCT: 29.1 % — ABNORMAL LOW (ref 39.0–52.0)
Hemoglobin: 9.9 g/dL — ABNORMAL LOW (ref 13.0–17.0)
MCH: 32 pg (ref 26.0–34.0)
MCHC: 34 g/dL (ref 30.0–36.0)
MCV: 94.2 fL (ref 80.0–100.0)
Platelets: 733 10*3/uL — ABNORMAL HIGH (ref 150–400)
RBC: 3.09 MIL/uL — ABNORMAL LOW (ref 4.22–5.81)
RDW: 14.2 % (ref 11.5–15.5)
WBC: 28.2 10*3/uL — ABNORMAL HIGH (ref 4.0–10.5)
nRBC: 0 % (ref 0.0–0.2)

## 2019-02-03 LAB — BASIC METABOLIC PANEL
Anion gap: 14 (ref 5–15)
BUN: 11 mg/dL (ref 6–20)
CO2: 23 mmol/L (ref 22–32)
Calcium: 8.1 mg/dL — ABNORMAL LOW (ref 8.9–10.3)
Chloride: 103 mmol/L (ref 98–111)
Creatinine, Ser: 0.97 mg/dL (ref 0.61–1.24)
GFR calc Af Amer: >60 mL/min (ref >60)
GFR calc non Af Amer: >60 mL/min (ref >60)
Glucose, Bld: 113 mg/dL — ABNORMAL HIGH (ref 70–99)
Potassium: 3.4 mmol/L — ABNORMAL LOW (ref 3.5–5.1)
Sodium: 140 mmol/L (ref 135–145)

## 2019-02-03 MED ORDER — POTASSIUM CHLORIDE 10 MEQ/100ML IV SOLN
10.0000 meq | INTRAVENOUS | Status: AC
  Administered 2019-02-03 (×2): 10 meq via INTRAVENOUS
  Filled 2019-02-03 (×2): qty 100

## 2019-02-03 NOTE — Progress Notes (Signed)
Patient wants urinary catheter out
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
## 2019-02-03 NOTE — Consult Note (Addendum)
WOC Nurse ostomy follow up Surgical team following for assessment and plan of care to abd wound.  Stoma type/location: Ostomy teaching session performed with patient and he face-timed with a family member who also watched via phone.  Stomal assessment/size: Stoma is red and viable, above skin level, 1 3/4 inches Peristomal assessment: intact skin surrounding Output: Small amt brown liquid stool Ostomy pouching: 2pc.  Education provided:  Demonstrated pouch change using 2 piece system.  Patient watched and asked appropriate questions.  Reviewed pouching routines and emptying.  Enrolled patient in [place] Secure Start Discharge program: Yes WOC team will continue to follow for further teaching sessions.  Supplies ordered to the room for staff nurse use.  Cammie Mcgee MSN, RN, CWOCN, [place], CNS [phone]
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
## 2019-02-03 NOTE — Progress Notes (Signed)
IR rounding note via telephone per new regulations. Spoke with Almira Coaster, RN.  Patient with history of LUQ subcapsular fluid collection (suspected biloma) s/p drain placement 02/01/2019 by Dr. Miles Costain.  RN reports LUQ drain site c/d/i with approximately 25 cc clear yellow/amber fluid with debris in JP drain. Reports drain flushes/asiprates without resistance.  Continue with Qshift flushes/monitor of output. Plans per [facility]- appreciate and agree with management. IR to follow.   Waylan Boga Roxann Vierra, PA-C 02/03/2019, 2:16 PM

## 2019-02-03 NOTE — Progress Notes (Signed)
Spoke with Joni Reining, patient's sister, and gave her updates on patient today.  Letha Cape 9:58 AM 02/03/2019

## 2019-02-03 NOTE — Progress Notes (Addendum)
7 Days Post-Op   Subjective/Chief Complaint: Pt with no changes overnight Report some choking issues with pills but now with water.   Objective: Vital signs in last 24 hours: Temp:  [98.4 F (36.9 C)-100.1 F (37.8 C)] 99.7 F (37.6 C) (05/11 0824) Pulse Rate:  [67-97] 86 (05/11 0824) Resp:  [17-32] 28 (05/11 0824) BP: (116-153)/(81-103) 153/97 (05/11 0824) SpO2:  [95 %-99 %] 99 % (05/11 0824) Weight:  [72.3 kg] 72.3 kg (05/11 0501) Last BM Date: 02/01/19  Intake/Output from previous day: 05/10 0701 - 05/11 0700 In: 1731.6 [I.V.:1183.9; IV Piggyback:547.7] Out: 1620 [Urine:1275; Drains:145; Stool:200] Intake/Output this shift: No intake/output data recorded.  Constitutional: No acute distress, conversant, appears states age. Eyes: Anicteric sclerae, moist conjunctiva, no lid lag Lungs: Clear to auscultation bilaterally, normal respiratory effort CV: regular rate and rhythm, no murmurs, no peripheral edema, pedal pulses 2+ GI: Soft, no masses or hepatosplenomegaly, non-tender to palpation, midline c/d/i, colostomy patent, JP bilious/SS Skin: No rashes, palpation reveals normal turgor Psychiatric: appropriate judgment and insight, oriented to person, place, and time   Lab Results:  Recent Labs    02/02/19 0042 02/03/19 0553  WBC 36.0* 28.2*  HGB 9.7* 9.9*  HCT 28.0* 29.1*  PLT 655* 733*   BMET Recent Labs    02/02/19 0042 02/03/19 0553  NA 138 140  K 3.2* 3.4*  CL 106 103  CO2 21* 23  GLUCOSE 111* 113*  BUN 12 11  CREATININE 1.08 0.97  CALCIUM 8.3* 8.1*    Studies/Results: Ir Era Skeen W Catheter Placement  Result Date: 02/01/2019 INDICATION: LEFT UPPER QUADRANT SUBCAPSULAR FLUID COLLECTION, SUSPECT BILOMA EXAM: ULTRASOUND DRAIN OF THE LEFT UPPER QUADRANT SUBCAPSULAR HEPATIC COLLECTION MEDICATIONS: The patient is currently admitted to the hospital and receiving intravenous antibiotics. The antibiotics were administered within an appropriate time frame  prior to the initiation of the procedure. ANESTHESIA/SEDATION: Fentanyl 75 mcg IV; Versed 1.5 mg IV Moderate Sedation Time:  10 MINUTES The patient was continuously monitored during the procedure by the interventional radiology nurse under my direct supervision. COMPLICATIONS: None immediate. PROCEDURE: Informed written consent was obtained from the patient after a thorough discussion of the procedural risks, benefits and alternatives. All questions were addressed. Maximal Sterile Barrier Technique was utilized including caps, mask, sterile gowns, sterile gloves, sterile drape, hand hygiene and skin antiseptic. A timeout was performed prior to the initiation of the procedure. Previous imaging reviewed. Preliminary ultrasound performed. The left upper quadrant subcapsular hepatic collection was localized. Overlying skin marked for an anterior lower intercostal approach. Under sterile conditions and local anesthesia, an 18 gauge 10 cm access needle was advanced into the collection. Needle position confirmed with ultrasound. There was return of bilious fluid. Sample sent for culture. Guidewire inserted followed by 10 [place] drain. Drain catheter position confirmed with ultrasound. 60 cc of bilious fluid aspirated. Drain secured with a Prolene suture and connected to external suction bulb. Sterile dressing applied. No immediate complication. Patient tolerated the procedure well. IMPRESSION: Successful ultrasound left upper quadrant subcapsular hepatic biloma drain placement Electronically Signed   By: Judie Petit.  Shick M.D.   On: 02/01/2019 10:41    Anti-infectives: Anti-infectives (From admission, onward)   Start     Dose/Rate Route Frequency Ordered Stop   01/29/19 1000  piperacillin-tazobactam (ZOSYN) IVPB 3.375 g     3.375 g 12.5 mL/hr over 240 Minutes Intravenous Every 8 hours 01/29/19 0931     01/27/19 0830  piperacillin-tazobactam (ZOSYN) IVPB 3.375 g     3.375 g  100 mL/hr over 30 Minutes Intravenous To  Surgery 01/27/19 [id number] 01/27/19 0855   01/25/19 0815  piperacillin-tazobactam (ZOSYN) IVPB 3.375 g     3.375 g 100 mL/hr over 30 Minutes Intravenous  Once 01/25/19 0814 01/25/19 1233   01/24/19 0145  cefoTEtan (CEFOTAN) 2 g in sodium chloride 0.9 % 100 mL IVPB     2 g 200 mL/hr over 30 Minutes Intravenous  Once 01/24/19 0137 01/24/19 0351      Assessment/Plan: S/P Ex lap with liver repair, gastrorrhaphy, transverse colectomy, proximal small bowel repair, mid-jejunal small bowel resection, bladder repair and left ureteral stent placement, abthera placement [name]- 01/24/19 01/25/19 - Abdominal washout/ VAC change - Dr. Cliffton AstersWhite 5/4/20abdominal closure and colostomy - Dr. Sheliah HatchKinsinger ABLA-Hgb stable FEN-NPO, Will ask ST to eval swallow, KCL VTE-cr normal , will use lovenox ID-biloma drained Sat, wbc decreasing, cont zosyn Neuro- CIWA protocol Bladder Injury- will need to keep foley in place for 14d and cysto prior to removal per URO Dispo-con't PCU, ask PT to see   LOS: 10 days    John Briggs 02/03/2019
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
## 2019-02-04 LAB — CBC
HCT: 27.6 % — ABNORMAL LOW (ref 39.0–52.0)
Hemoglobin: 9.3 g/dL — ABNORMAL LOW (ref 13.0–17.0)
MCH: 32 pg (ref 26.0–34.0)
MCHC: 33.7 g/dL (ref 30.0–36.0)
MCV: 94.8 fL (ref 80.0–100.0)
Platelets: 802 10*3/uL — ABNORMAL HIGH (ref 150–400)
RBC: 2.91 MIL/uL — ABNORMAL LOW (ref 4.22–5.81)
RDW: 14.3 % (ref 11.5–15.5)
WBC: 23 10*3/uL — ABNORMAL HIGH (ref 4.0–10.5)
nRBC: 0 % (ref 0.0–0.2)

## 2019-02-04 MED ORDER — ACETAMINOPHEN 650 MG RE SUPP: 650.0000 mg | Freq: Four times a day (QID) | RECTAL | Status: DC | PRN

## 2019-02-04 MED ORDER — ENSURE ENLIVE PO LIQD
237.0000 mL | Freq: Three times a day (TID) | ORAL | Status: DC
  Administered 2019-02-04 – 2019-02-11 (×17): 237 mL via ORAL

## 2019-02-04 MED ORDER — ACETAMINOPHEN 325 MG PO TABS
650.0000 mg | ORAL_TABLET | Freq: Four times a day (QID) | ORAL | Status: DC
  Administered 2019-02-04 – 2019-02-11 (×27): 650 mg via ORAL
  Filled 2019-02-04 (×27): qty 2

## 2019-02-04 MED ORDER — OXYCODONE HCL 5 MG PO TABS
5.0000 mg | ORAL_TABLET | ORAL | Status: DC | PRN
  Administered 2019-02-04 – 2019-02-11 (×22): 10 mg via ORAL
  Filled 2019-02-04 (×23): qty 2

## 2019-02-04 MED ORDER — ACETAMINOPHEN 650 MG RE SUPP: 650.0000 mg | Freq: Four times a day (QID) | RECTAL | Status: DC

## 2019-02-04 MED ORDER — HYDROMORPHONE HCL 1 MG/ML IJ SOLN
0.5000 mg | Freq: Four times a day (QID) | INTRAMUSCULAR | Status: DC | PRN
  Administered 2019-02-04 (×2): 1 mg via INTRAVENOUS
  Administered 2019-02-04: 0.5 mg via INTRAVENOUS
  Administered 2019-02-08 – 2019-02-09 (×2): 1 mg via INTRAVENOUS
  Filled 2019-02-04 (×4): qty 1

## 2019-02-04 MED ORDER — FLUCONAZOLE 200 MG PO TABS
200.0000 mg | ORAL_TABLET | Freq: Every day | ORAL | Status: DC
  Administered 2019-02-04 – 2019-02-11 (×8): 200 mg via ORAL
  Filled 2019-02-04 (×8): qty 1
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
## 2019-02-04 NOTE — Progress Notes (Signed)
Tried to start last bag of K+- even with new IV site and running with NaCl to attempt to dilute as well as lowering rate to  the patient could not tolerate and insisted the pump be turned off. Pt current K+ level is 3.4- will hand off to day shift nurse to consult MD.
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
## 2019-02-04 NOTE — Progress Notes (Signed)
Patient ID: John Briggs, male   DOB: [birth year], 33 y.o.   MRN: [id number]   IR rounding note via telephone per new regulations. Spoke with Almira Coaster, RN.  Patient with history of LUQ subcapsular fluid collection (suspected biloma) s/p drain placement 02/01/2019 by Dr. Miles Costain.  LUQ drain is intact 25 cc yesterday 15 cc in drain now OP bilious--FEW CANDIDA ALBICANS Flushes easily Site c/d/i  Will need CT when OP is less than 10 cc daily -- before drain removed  Continue with Qshift flushes/monitor of output. Plans per [facility]- appreciate and agree with management. IR to follow.

## 2019-02-04 NOTE — Progress Notes (Signed)
[facility] Progress Note  8 Days Post-Op  Subjective: CC-  Comfortable this morning. Tolerating regular diet. Denies any more choking with diet or pills. Denies n/v. Requiring mostly IV dilaudid for pain and taking some oxycodone.   Worked with PT/OT well yesterday, no f/u recommendations.   Objective: Vital signs in last 24 hours: Temp:  [99.6 F (37.6 C)-100.1 F (37.8 C)] 99.7 F (37.6 C) (05/12 0300) Pulse Rate:  [96-105] 96 (05/12 0300) Resp:  [14-28] 14 (05/12 0300) BP: (132-145)/(92-99) 142/92 (05/12 0300) SpO2:  [96 %-100 %] 100 % (05/11 2300) Last BM Date: 02/01/19  Intake/Output from previous day: 05/11 0701 - 05/12 0700 In: -  Out: 800 [Urine:775; Drains:25] Intake/Output this shift: No intake/output data recorded.  PE: Gen:  Alert, NAD HEENT: EOM's intact, pupils equal and round Card:  RRR, 2+ DP pulses bilaterally Pulm:  CTAB, no W/R/R, effort normal Abd: Soft, ND, appropriately tender, +BS, open midline incision pink without drainage, colostomy pink with soft brown stool in pouch, JP drain SS, IR drain golden bile Ext: calves soft and nontender without edema Psych: A&Ox3  Skin: no rashes noted, warm and dry  Lab Results:  Recent Labs    02/03/19 0553 02/04/19 0203  WBC 28.2* 23.0*  HGB 9.9* 9.3*  HCT 29.1* 27.6*  PLT 733* 802*   BMET Recent Labs    02/02/19 0042 02/03/19 0553  NA 138 140  K 3.2* 3.4*  CL 106 103  CO2 21* 23  GLUCOSE 111* 113*  BUN 12 11  CREATININE 1.08 0.97  CALCIUM 8.3* 8.1*   PT/INR No results for input(s): LABPROT, INR in the last 72 hours. CMP     Component Value Date/Time   NA 140 02/03/2019 0553   K 3.4 (L) 02/03/2019 0553   CL 103 02/03/2019 0553   CO2 23 02/03/2019 0553   GLUCOSE 113 (H) 02/03/2019 0553   BUN 11 02/03/2019 0553   CREATININE 0.97 02/03/2019 0553   CALCIUM 8.1 (L) 02/03/2019 0553   PROT 6.6 02/02/2019 0042   ALBUMIN 1.8 (L) 02/02/2019 0042   AST 27 02/02/2019 0042   ALT 23 02/02/2019 0042   ALKPHOS 144 (H) 02/02/2019 0042   BILITOT 1.0 02/02/2019 0042   GFRNONAA >60 02/03/2019 0553   GFRAA >60 02/03/2019 0553   Lipase  No results found for: LIPASE     Studies/Results: No results found.  Anti-infectives: Anti-infectives (From admission, onward)   Start     Dose/Rate Route Frequency Ordered Stop   01/29/19 1000  piperacillin-tazobactam (ZOSYN) IVPB 3.375 g     3.375 g 12.5 mL/hr over 240 Minutes Intravenous Every 8 hours 01/29/19 0931     01/27/19 0830  piperacillin-tazobactam (ZOSYN) IVPB 3.375 g     3.375 g 100 mL/hr over 30 Minutes Intravenous To Surgery 01/27/19 [id number] 01/27/19 0855   01/25/19 0815  piperacillin-tazobactam (ZOSYN) IVPB 3.375 g     3.375 g 100 mL/hr over 30 Minutes Intravenous  Once 01/25/19 0814 01/25/19 1233   01/24/19 0145  cefoTEtan (CEFOTAN) 2 g in sodium chloride 0.9 % 100 mL IVPB     2 g 200 mL/hr over 30 Minutes Intravenous  Once 01/24/19 0137 01/24/19 0351       Assessment/Plan S/P Ex lap with liver repair, gastrorrhaphy, transverse colectomy, proximal small bowel repair, mid-jejunal small bowel resection, bladder repair and left ureteral stent placement, abthera placement [name]- 01/24/19 01/25/19 - Abdominal washout/ VAC change - Dr. Cliffton AstersWhite 5/4/20abdominal closure and colostomy -  Dr. Sheliah Hatch Bladder Injury- s/p L ureteral stent and Closure cystostomy 5/1 Dr. Ronne Binning. Will need to keep foley in place for 14d and cysto prior to removal per URO Subcapsular fluid collection/biloma - s/p IR drain 5/9, culture FEW CANDIDA ALBICANS report pending. Continue zosyn and await official report, start diflucan ABLA-Hgb 9.3 from 9.9, VSS, stable Hypokalemia - check BMP in AM Alcohol use - CIWA protocol  FEN-IVF, regular diet, Ensure VTE-lovenox Foley - continue per urology, planning cysto on 5/15 ID-cefotetan 5/1, zosyn 5/2 and 5/4 and 5/6>> . WBC trending down 23 from 28.2, afebrile  Dispo-  Continue therapies. Working on pain control, schedule tylenol, wean dilaudid and increase oxy scale 5-10mg . Start PO diflucan. Labs in AM.    LOS: 11 days    Franne Forts , [facility] 02/04/2019, 10:11 AM Pager: [phone] Mon-Thurs 7:00 am-4:30 pm Fri 7:00 am -11:30 AM Sat-Sun 7:00 am-11:30 am
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
## 2019-02-05 LAB — BASIC METABOLIC PANEL
Anion gap: 11 (ref 5–15)
BUN: 9 mg/dL (ref 6–20)
CO2: 26 mmol/L (ref 22–32)
Calcium: 8.4 mg/dL — ABNORMAL LOW (ref 8.9–10.3)
Chloride: 101 mmol/L (ref 98–111)
Creatinine, Ser: 0.97 mg/dL (ref 0.61–1.24)
GFR calc Af Amer: >60 mL/min (ref >60)
GFR calc non Af Amer: >60 mL/min (ref >60)
Glucose, Bld: 99 mg/dL (ref 70–99)
Potassium: 3.2 mmol/L — ABNORMAL LOW (ref 3.5–5.1)
Sodium: 138 mmol/L (ref 135–145)

## 2019-02-05 LAB — CBC
HCT: 27.4 % — ABNORMAL LOW (ref 39.0–52.0)
Hemoglobin: 9.2 g/dL — ABNORMAL LOW (ref 13.0–17.0)
MCH: 31.7 pg (ref 26.0–34.0)
MCHC: 33.6 g/dL (ref 30.0–36.0)
MCV: 94.5 fL (ref 80.0–100.0)
Platelets: 798 10*3/uL — ABNORMAL HIGH (ref 150–400)
RBC: 2.9 MIL/uL — ABNORMAL LOW (ref 4.22–5.81)
RDW: 14.3 % (ref 11.5–15.5)
WBC: 18.5 10*3/uL — ABNORMAL HIGH (ref 4.0–10.5)
nRBC: 0 % (ref 0.0–0.2)

## 2019-02-05 LAB — MAGNESIUM: Magnesium: 1.6 mg/dL — ABNORMAL LOW (ref 1.7–2.4)

## 2019-02-05 MED ORDER — POTASSIUM CHLORIDE CRYS ER 20 MEQ PO TBCR
40.0000 meq | EXTENDED_RELEASE_TABLET | Freq: Once | ORAL | Status: AC
  Administered 2019-02-05: 40 meq via ORAL
  Filled 2019-02-05: qty 2

## 2019-02-05 MED ORDER — MAGNESIUM SULFATE 2 GM/50ML IV SOLN
2.0000 g | Freq: Once | INTRAVENOUS | Status: AC
  Administered 2019-02-05: 2 g via INTRAVENOUS
  Filled 2019-02-05: qty 50

## 2019-02-05 NOTE — Progress Notes (Signed)
Spoke to Wilsey on the phone for 20 minutes with update on Nivan.  Letha Cape 11:14 AM 02/05/2019

## 2019-02-05 NOTE — Progress Notes (Signed)
Patient ID: John Briggs, male   DOB: 1986-04-26, 33 y.o.   MRN: [id number]    9 Days Post-Op  Subjective: No new complaints.  Working well with therapies.  No follow up recommended by PT/OT.  Eating some and drinking his ensure.  Working with his colostomy and learning how to manage it.  Objective: Vital signs in last 24 hours: Temp:  [98.3 F (36.8 C)-99.2 F (37.3 C)] 98.7 F (37.1 C) (05/13 0824) Pulse Rate:  [71-98] 84 (05/13 0824) Resp:  [11-26] 19 (05/13 0824) BP: (127-132)/(88-112) 129/96 (05/13 0824) SpO2:  [99 %-100 %] 100 % (05/13 0824) Last BM Date: 02/04/19  Intake/Output from previous day: 05/12 0701 - 05/13 0700 In: 1310 [P.O.:1200; I.V.:110] Out: 965 [Urine:700; Drains:65; Stool:200] Intake/Output this shift: Total I/O In: 240 [P.O.:240] Out: 300 [Urine:200; Stool:100]  PE: Gen: NAD Heart: regular Lungs: CTAB Abd: soft, midline wound is mostly clean and flush to his skin, open.  Colostomy with a viable stoma and stool in his bag.  LUQ drain with some bile tinged fluid (not a lot).  Right JP drain with minimal output. Ext: NVI, MAE  Lab Results:  Recent Labs    02/04/19 0203 02/05/19 0201  WBC 23.0* 18.5*  HGB 9.3* 9.2*  HCT 27.6* 27.4*  PLT 802* 798*   BMET Recent Labs    02/03/19 0553 02/05/19 0201  NA 140 138  K 3.4* 3.2*  CL 103 101  CO2 23 26  GLUCOSE 113* 99  BUN 11 9  CREATININE 0.97 0.97  CALCIUM 8.1* 8.4*   PT/INR No results for input(s): LABPROT, INR in the last 72 hours. CMP     Component Value Date/Time   NA 138 02/05/2019 0201   K 3.2 (L) 02/05/2019 0201   CL 101 02/05/2019 0201   CO2 26 02/05/2019 0201   GLUCOSE 99 02/05/2019 0201   BUN 9 02/05/2019 0201   CREATININE 0.97 02/05/2019 0201   CALCIUM 8.4 (L) 02/05/2019 0201   PROT 6.6 02/02/2019 0042   ALBUMIN 1.8 (L) 02/02/2019 0042   AST 27 02/02/2019 0042   ALT 23 02/02/2019 0042   ALKPHOS 144 (H) 02/02/2019 0042   BILITOT 1.0 02/02/2019 0042   GFRNONAA >60  02/05/2019 0201   GFRAA >60 02/05/2019 0201   Lipase  No results found for: LIPASE     Studies/Results: No results found.  Anti-infectives: Anti-infectives (From admission, onward)   Start     Dose/Rate Route Frequency Ordered Stop   02/04/19 1100  fluconazole (DIFLUCAN) tablet 200 mg     200 mg Oral Daily 02/04/19 1050     01/29/19 1000  piperacillin-tazobactam (ZOSYN) IVPB 3.375 g     3.375 g 12.5 mL/hr over 240 Minutes Intravenous Every 8 hours 01/29/19 0931     01/27/19 0830  piperacillin-tazobactam (ZOSYN) IVPB 3.375 g     3.375 g 100 mL/hr over 30 Minutes Intravenous To Surgery 01/27/19 0822 01/27/19 0855   01/25/19 0815  piperacillin-tazobactam (ZOSYN) IVPB 3.375 g     3.375 g 100 mL/hr over 30 Minutes Intravenous  Once 01/25/19 0814 01/25/19 1233   01/24/19 0145  cefoTEtan (CEFOTAN) 2 g in sodium chloride 0.9 % 100 mL IVPB     2 g 200 mL/hr over 30 Minutes Intravenous  Once 01/24/19 0137 01/24/19 0351       Assessment/Plan S/P Ex lap with liver repair, gastrorrhaphy, transverse colectomy, proximal small bowel repair, mid-jejunal small bowel resection, bladder repair and left ureteral stent placement, abthera placement (  Janee Mornhompson and Mckenzie)- 01/24/19 01/25/19 - Abdominal washout/ VAC change - Dr. Cliffton AstersWhite 5/4/20abdominal closure and colostomy - Dr. Sheliah HatchKinsinger -on regular diet and Ensure -WBC down to 18K Bladder Injury- s/p L ureteral stent and Closure cystostomy 5/1 Dr. Ronne BinningMcKenzie. Will need to keep foley in place for 14d and cysto prior to removal per URO Subcapsular fluid collection/biloma - s/p IR drain 5/9, culture FEW CANDIDA ALBICANS report pending. Continue zosyn and await official report, start diflucan ABLA-Hgb 9.2, stable Hypokalemia - replace today, check in AM Alcohol use - CIWA protocol  FEN-IVF, regular diet, Ensure VTE-lovenox Foley - continue per urology, planning cysto on 5/15 ID-cefotetan 5/1, zosyn 5/2 and 5/4 and 5/6>> . WBC trending down 18   Dispo- possibly home given on PT/OT follow up recommended, [facility], but given social situation will verify this is a safe plan.   LOS: 12 days    [name] , [facility] 02/05/2019, 10:39 AM Pager: [phone]
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
## 2019-02-05 NOTE — Consult Note (Signed)
WOC Nurse ostomy follow up Surgical team following for assessment and plan of care to abd wound.  Stoma type/location: Current pouch had leaked behind the barrier, but did not soil the abd wound. Changed moist gauze and abd pad.  Ostomy teaching session performed with patient; he was able to assist with pouch application and could open and close the velcro without assistance to empty.  Discussed pouching routines and ordering supplies. Reviewed indigent information contact phone number, since he does not have insurance.  Educational materials left at the bedside and 5 sets of pouches and wafers for discharge home.  Progress notes indicate he will have home health assistance after discharge.   Stomal assessment/size: Stoma is red and viable, above skin level, 1 3/4 inches Peristomal assessment: intact skin surrounding Output: Small amt brown liquid stool Ostomy pouching: 2pc.  Education provided:  Enrolled patient in [place] Secure Start Discharge program: Yes Kirubel Aja Sylvie Farrier MSN, RN, [place], [place], [phone]

## 2019-02-06 LAB — CBC
HCT: 30.4 % — ABNORMAL LOW (ref 39.0–52.0)
Hemoglobin: 10.1 g/dL — ABNORMAL LOW (ref 13.0–17.0)
MCH: 31.5 pg (ref 26.0–34.0)
MCHC: 33.2 g/dL (ref 30.0–36.0)
MCV: 94.7 fL (ref 80.0–100.0)
Platelets: 809 10*3/uL — ABNORMAL HIGH (ref 150–400)
RBC: 3.21 MIL/uL — ABNORMAL LOW (ref 4.22–5.81)
RDW: 14.1 % (ref 11.5–15.5)
WBC: 18.7 10*3/uL — ABNORMAL HIGH (ref 4.0–10.5)
nRBC: 0 % (ref 0.0–0.2)

## 2019-02-06 LAB — AEROBIC/ANAEROBIC CULTURE W GRAM STAIN (SURGICAL/DEEP WOUND)

## 2019-02-06 LAB — BASIC METABOLIC PANEL
Anion gap: 13 (ref 5–15)
BUN: 8 mg/dL (ref 6–20)
CO2: 27 mmol/L (ref 22–32)
Calcium: 8.7 mg/dL — ABNORMAL LOW (ref 8.9–10.3)
Chloride: 99 mmol/L (ref 98–111)
Creatinine, Ser: 0.92 mg/dL (ref 0.61–1.24)
GFR calc Af Amer: >60 mL/min (ref >60)
GFR calc non Af Amer: >60 mL/min (ref >60)
Glucose, Bld: 90 mg/dL (ref 70–99)
Potassium: 3.5 mmol/L (ref 3.5–5.1)
Sodium: 139 mmol/L (ref 135–145)

## 2019-02-06 LAB — AEROBIC/ANAEROBIC CULTURE (SURGICAL/DEEP WOUND): Special Requests: NORMAL

## 2019-02-06 MED ORDER — ACETAMINOPHEN 325 MG PO TABS: 650.0000 mg | ORAL_TABLET | Freq: Four times a day (QID) | ORAL | Status: DC | PRN
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
## 2019-02-06 NOTE — Progress Notes (Signed)
Patient ID: John Briggs, male   DOB: [birth year], 33 y.o.   MRN: [id number]    10 Days Post-Op  Subjective: No new complaints.  Eating better.  Thinks if he can't get HH that he can likely change his colostomy and midline wound on his own with the help of his GF.  Objective: Vital signs in last 24 hours: Temp:  [99 F (37.2 C)-99.4 F (37.4 C)] 99 F (37.2 C) (05/14 0813) Pulse Rate:  [72-95] 87 (05/14 0813) Resp:  [21-27] 21 (05/14 0813) BP: (113-135)/(76-95) 126/76 (05/14 0813) SpO2:  [94 %-99 %] 98 % (05/14 0813) Last BM Date: 02/06/19  Intake/Output from previous day: 05/13 0701 - 05/14 0700 In: 490 [P.O.:480] Out: 940 [Urine:600; Drains:40; Stool:300] Intake/Output this shift: Total I/O In: 380 [P.O.:120; I.V.:260] Out: -   PE: Gen: NAD Heart: regular Lungs: CTAB Abd: soft, midline wound is stable, some necrosis at base, but otherwise clean, colostomy in place with good feculent output.  LUQ drain with minimal bile-tinged fluid, 30 cc yesterday.Marland Kitchen  RLQ drain with scant serosang output, 10 cc total.   Lab Results:  Recent Labs    02/05/19 0201 02/06/19 0504  WBC 18.5* 18.7*  HGB 9.2* 10.1*  HCT 27.4* 30.4*  PLT 798* 809*   BMET Recent Labs    02/05/19 0201 02/06/19 0504  NA 138 139  K 3.2* 3.5  CL 101 99  CO2 26 27  GLUCOSE 99 90  BUN 9 8  CREATININE 0.97 0.92  CALCIUM 8.4* 8.7*   PT/INR No results for input(s): LABPROT, INR in the last 72 hours. CMP     Component Value Date/Time   NA 139 02/06/2019 0504   K 3.5 02/06/2019 0504   CL 99 02/06/2019 0504   CO2 27 02/06/2019 0504   GLUCOSE 90 02/06/2019 0504   BUN 8 02/06/2019 0504   CREATININE 0.92 02/06/2019 0504   CALCIUM 8.7 (L) 02/06/2019 0504   PROT 6.6 02/02/2019 0042   ALBUMIN 1.8 (L) 02/02/2019 0042   AST 27 02/02/2019 0042   ALT 23 02/02/2019 0042   ALKPHOS 144 (H) 02/02/2019 0042   BILITOT 1.0 02/02/2019 0042   GFRNONAA >60 02/06/2019 0504   GFRAA >60 02/06/2019 0504    Lipase  No results found for: LIPASE     Studies/Results: No results found.  Anti-infectives: Anti-infectives (From admission, onward)   Start     Dose/Rate Route Frequency Ordered Stop   02/04/19 1100  fluconazole (DIFLUCAN) tablet 200 mg     200 mg Oral Daily 02/04/19 1050     01/29/19 1000  piperacillin-tazobactam (ZOSYN) IVPB 3.375 g     3.375 g 12.5 mL/hr over 240 Minutes Intravenous Every 8 hours 01/29/19 0931     01/27/19 0830  piperacillin-tazobactam (ZOSYN) IVPB 3.375 g     3.375 g 100 mL/hr over 30 Minutes Intravenous To Surgery 01/27/19 0822 01/27/19 0855   01/25/19 0815  piperacillin-tazobactam (ZOSYN) IVPB 3.375 g     3.375 g 100 mL/hr over 30 Minutes Intravenous  Once 01/25/19 0814 01/25/19 1233   01/24/19 0145  cefoTEtan (CEFOTAN) 2 g in sodium chloride 0.9 % 100 mL IVPB     2 g 200 mL/hr over 30 Minutes Intravenous  Once 01/24/19 0137 01/24/19 0351       Assessment/Plan S/P Ex lap with liver repair, gastrorrhaphy, transverse colectomy, proximal small bowel repair, mid-jejunal small bowel resection, bladder repair and left ureteral stent placement, abthera placement Janee Morn and Mckenzie)- 01/24/19 01/25/19 - Abdominal  washout/ VAC change - Dr. Cliffton AstersWhite 5/4/20abdominal closure and colostomy - Dr. Sheliah HatchKinsinger -on regular diet and Ensure -WBC down to 18K Bladder Injury-s/p L ureteral stent and Closure cystostomy 5/1 Dr. Ronne BinningMcKenzie. CT cysto tomorrow. Subcapsular fluid collection/biloma - s/p IR drain 5/9, culture FEW CANDIDA ALBICANS report pending.Continue zosyn and await official report, start diflucan.  Will check CT tomorrow when down for cystogram ABLA-Hgb 9.2, stable Hypokalemia - 3.5 Alcohol use - CIWA protocol  FEN-IVF, regular diet, Ensure VTE-lovenox Foley - continue per urology, planning cysto on 5/15 ID-cefotetan 5/1, zosyn 5/2 and 5/4 and 5/6>> . WBC trending down 18  [facility].   LOS: 13  days    Letha CapeKelly E Kenzlee Fishburn , [facility] 02/06/2019, 10:13 AM Pager: [phone]

## 2019-02-07 ENCOUNTER — Inpatient Hospital Stay (HOSPITAL_COMMUNITY): Payer: No Typology Code available for payment source

## 2019-02-07 LAB — PROTIME-INR
INR: 1.3 — ABNORMAL HIGH (ref 0.8–1.2)
Prothrombin Time: 15.6 seconds — ABNORMAL HIGH (ref 11.4–15.2)

## 2019-02-07 MED ORDER — LABETALOL HCL 5 MG/ML IV SOLN
5.0000 mg | INTRAVENOUS | Status: AC | PRN
  Administered 2019-02-07 (×2): 5 mg via INTRAVENOUS

## 2019-02-07 MED ORDER — LIDOCAINE-EPINEPHRINE 1 %-1:100000 IJ SOLN
INTRAMUSCULAR | Status: AC
  Administered 2019-02-07: 18:00:00
  Filled 2019-02-07: qty 1

## 2019-02-07 MED ORDER — IOHEXOL 300 MG/ML  SOLN
100.0000 mL | Freq: Once | INTRAMUSCULAR | Status: AC | PRN
  Administered 2019-02-07: 100 mL via INTRAVENOUS

## 2019-02-07 MED ORDER — IOTHALAMATE MEGLUMINE 17.2 % UR SOLN
250.0000 mL | Freq: Once | URETHRAL | Status: AC | PRN
  Administered 2019-02-07: 250 mL via INTRAVESICAL

## 2019-02-07 MED ORDER — MIDAZOLAM HCL 2 MG/2ML IJ SOLN
INTRAMUSCULAR | Status: AC
  Administered 2019-02-07: 18:00:00
  Filled 2019-02-07: qty 4

## 2019-02-07 MED ORDER — SODIUM CHLORIDE 0.9 % IV SOLN
INTRAVENOUS | Status: AC | PRN
  Administered 2019-02-07: 10 mL/h via INTRAVENOUS

## 2019-02-07 MED ORDER — MIDAZOLAM HCL 2 MG/2ML IJ SOLN
INTRAMUSCULAR | Status: AC | PRN
  Administered 2019-02-07 (×4): 1 mg via INTRAVENOUS

## 2019-02-07 MED ORDER — LABETALOL HCL 5 MG/ML IV SOLN
INTRAVENOUS | Status: AC
  Administered 2019-02-07: 19:00:00
  Filled 2019-02-07: qty 4

## 2019-02-07 MED ORDER — SODIUM CHLORIDE 0.9% FLUSH
5.0000 mL | Freq: Three times a day (TID) | INTRAVENOUS | Status: DC
  Administered 2019-02-07 – 2019-02-11 (×10): 5 mL

## 2019-02-07 MED ORDER — FENTANYL CITRATE (PF) 100 MCG/2ML IJ SOLN
INTRAMUSCULAR | Status: AC | PRN
  Administered 2019-02-07: 50 ug via INTRAVENOUS
  Administered 2019-02-07: 100 ug via INTRAVENOUS

## 2019-02-07 MED ORDER — FENTANYL CITRATE (PF) 100 MCG/2ML IJ SOLN
INTRAMUSCULAR | Status: AC
  Administered 2019-02-07: 18:00:00
  Filled 2019-02-07: qty 4
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
## 2019-02-07 NOTE — Procedures (Signed)
Pre procedural Dx: Post op abscess Post procedural Dx: Same  Technically successful CT guided placed of a 10 Fr drainage catheter placement into the residual perihepatic fluid collection yielding 30 cc of blood tinged fluid.   All aspirated samples sent to the laboratory for analysis.    Technically successful CT guided exchange of existing 10 Fr perihepatic abscess drain.  Both drains connected to JP bulbs.   EBL: Minimal  Complications: None immediate  Katherina Right, MD Pager #: [phone]

## 2019-02-07 NOTE — Discharge Instructions (Addendum)
Call [phone] Option #6 for [facility] supplies for home care    Drain care: Flush abdominal drains (two) each with 5cc normal saline twice daily. Measure and record output.  Wound care: Cleanse midline abdominal incision with normal saline and pat dry. Dampen kerlex with normal saline and pack wound from base to skin level. Cover with ABD pad and tape.  Ok to shower with wound open. Redress after shower.   Colostomy Home Guide, Adult  Colostomy surgery is done to create an opening in the front of the abdomen for stool (feces) to leave the body through an ostomy (stoma). Part of the large intestine is attached to the stoma. A bag, also called a pouch, is fitted over the stoma. Stool and gas will collect in the bag. After surgery, you will need to empty and change your colostomy bag as needed. You will also need to care for your stoma. How to care for the stoma Your stoma should look pink, red, and moist, like the inside of your cheek. Soon after surgery, the stoma may be swollen, but this swelling will go away within 6 weeks. To care for the stoma:  Keep the skin around the stoma clean and dry.  Use a clean, soft washcloth to gently wash the stoma and the skin around it. Clean using a circular motion, and wipe away from the stoma opening, not toward it. ? Use warm water and only use cleansers recommended by your health care provider. ? Rinse the stoma area with plain water. ? Dry the area around the stoma well.  Use stoma powder or ointment on your skin only as told by your health care provider. Do not use any other powders, gels, wipes, or creams on the skin around the stoma.  Check the stoma area every day for signs of infection. Check for: ? New or worsening redness, swelling, or pain. ? New or increased fluid or blood. ? Pus or warmth.  Measure the stoma opening regularly and record the size. Watch for changes. (It is normal for the stoma to get smaller as swelling goes  away.) Share this information with your health care provider. How to empty the colostomy bag  Empty your bag at bedtime and whenever it is one-third to one-half full. Do not let the bag get more than half-full with stool or gas. The bag could leak if it gets too full. Some colostomy bags have a built-in gas release valve that releases gas often throughout the day. Follow these basic steps: 1. Wash your hands with soap and water. 2. Sit far back on the toilet seat. 3. Put several pieces of toilet paper into the toilet water. This will prevent splashing as you empty stool into the toilet. 4. Remove the clip or the hook-and-loop fastener from the tail end of the bag. 5. Unroll the tail, then empty the stool into the toilet. 6. Clean the tail with toilet paper or a moist towelette. 7. Reroll the tail, and close it with the clip or the hook-and-loop fastener. 8. Wash your hands again. How to change the colostomy bag Change your bag every 3-4 days or as often as told by your health care provider. Also change the bag if it is leaking or separating from the skin, or if your skin around the stoma looks or feels irritated. Irritated skin may be a sign that the bag is leaking. Always have colostomy supplies with you, and follow these basic steps: 1. Wash your hands with soap and  water. Have paper towels or tissues nearby to clean any discharge. 2. Remove the old bag and skin barrier. Use your fingers or a warm cloth to gently push the skin away from the barrier. 3. Clean the stoma area with water or with mild soap and water, as directed. Use water to rinse away any soap. 4. Dry the skin. You may use the cool setting on a hair dryer to do this. 5. Use a tracing pattern (template) to cut the skin barrier to the size needed. 6. If you are using a two-piece bag, attach the bag and the skin barrier to each other. Add the barrier ring, if you use one. 7. If directed, apply stoma powder or skin barrier gel to  the skin. 8. Warm the skin barrier with your hands, or blow with a hair dryer for 5-10 seconds. 9. Remove the paper from the adhesive strip of the skin barrier. 10. Press the adhesive strip onto the skin around the stoma. 11. Gently rub the skin barrier onto the skin. This creates heat that helps the barrier to stick. 12. Apply stoma tape to the edges of the skin barrier, if desired. 13. Wash your hands again. General recommendations  Avoid wearing tight clothes or having anything press directly on your stoma or bag. Change your clothing whenever it is soiled or damp.  You may shower or bathe with the bag on or off. Do not use harsh or oily soaps or lotions. Dry the skin and bag after bathing.  Store all supplies in a cool, dry place. Do not leave supplies in extreme heat because some parts can melt or not stick as well.  Whenever you leave home, take extra clothing and an extra skin barrier and bag with you.  If your bag gets wet, you can dry it with a hair dryer on the cool setting.  To prevent odor, you may put drops of ostomy deodorizer in the bag.  If recommended by your health care provider, put ostomy lubricant inside the bag. This helps stool to slide out of the bag more easily and completely. Contact a health care provider if:  You have new or worsening redness, swelling, or pain around your stoma.  You have new or increased fluid or blood coming from your stoma.  Your stoma feels warm to the touch.  You have pus coming from your stoma.  Your stoma extends in or out farther than normal.  You need to change your bag every day.  You have a fever. Get help right away if:  Your stool is bloody.  You have nausea or you vomit.  You have trouble breathing. Summary  Measure your stoma opening regularly and record the size. Watch for changes.  Empty your bag at bedtime and whenever it is one-third to one-half full. Do not let the bag get more than half-full with stool  or gas.  Change your bag every 3-4 days or as often as told by your health care provider.  Whenever you leave home, take extra clothing and an extra skin barrier and bag with you. This information is not intended to replace advice given to you by your health care provider. Make sure you discuss any questions you have with your health care provider. Document Released: 09/14/2003 Document Revised: 05/17/2018 Document Reviewed: 03/07/2017 Elsevier Interactive Patient Education  2019 Elsevier Inc.    Percutaneous Abscess Drain, Care After This sheet gives you information about how to care for yourself after your procedure. Your  health care provider may also give you more specific instructions. If you have problems or questions, contact your health care provider. What can I expect after the procedure? After your procedure, it is common to have:  A small amount of bruising and discomfort in the area where the drainage tube (catheter) was placed.  Sleepiness and fatigue. This should go away after the medicines you were given have worn off. Follow these instructions at home: Incision care  Follow instructions from your health care provider about how to take care of your incision. Make sure you: ? Wash your hands with soap and water before you change your bandage (dressing). If soap and water are not available, use hand sanitizer. ? Change your dressing as told by your health care provider. ? Leave stitches (sutures), skin glue, or adhesive strips in place. These skin closures may need to stay in place for 2 weeks or longer. If adhesive strip edges start to loosen and curl up, you may trim the loose edges. Do not remove adhesive strips completely unless your health care provider tells you to do that.  Check your incision area every day for signs of infection. Check for: ? More redness, swelling, or pain. ? More fluid or blood. ? Warmth. ? Pus or a bad smell. ? Fluid leaking from around your  catheter (instead of fluid draining through your catheter). Catheter care   Follow instructions from your health care provider about emptying and cleaning your catheter and collection bag. You may need to clean the catheter every day so it does not clog.  If directed, write down the following information every time you empty your bag: ? The date and time. ? The amount of drainage. General instructions  Rest at home for 1-2 days after your procedure. Return to your normal activities as told by your health care provider.  Do not take baths, swim, or use a hot tub for 24 hours after your procedure, or until your health care provider says that this is okay.  Take over-the-counter and prescription medicines only as told by your health care provider.  Keep all follow-up visits as told by your health care provider. This is important. Contact a health care provider if:  You have less than 10 mL of drainage a day for 2-3 days in a row, or as directed by your health care provider.  You have more redness, swelling, or pain around your incision area.  You have more fluid or blood coming from your incision area.  Your incision area feels warm to the touch.  You have pus or a bad smell coming from your incision area.  You have fluid leaking from around your catheter (instead of through your catheter).  You have a fever or chills.  You have pain that does not get better with medicine. Get help right away if:  Your catheter comes out.  You suddenly stop having drainage from your catheter.  You suddenly have blood in the fluid that is draining from your catheter.  You become dizzy or you faint.  You develop a rash.  You have nausea or vomiting.  You have difficulty breathing or you feel short of breath.  You develop chest pain.  You have problems with your speech or vision.  You have trouble balancing or moving your arms or legs. Summary  It is common to have a small amount  of bruising and discomfort in the area where the drainage tube (catheter) was placed.  You may be  directed to record the amount of drainage from the bag every time you empty it.  Follow instructions from your health care provider about emptying and cleaning your catheter and collection bag. This information is not intended to replace advice given to you by your health care provider. Make sure you discuss any questions you have with your health care provider. Document Released: 01/26/2014 Document Revised: 08/03/2016 Document Reviewed: 08/03/2016 Elsevier Interactive Patient Education  2019 [employer]
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
## 2019-02-08 LAB — BASIC METABOLIC PANEL
Anion gap: 15 (ref 5–15)
BUN: 9 mg/dL (ref 6–20)
CO2: 27 mmol/L (ref 22–32)
Calcium: 9 mg/dL (ref 8.9–10.3)
Chloride: 99 mmol/L (ref 98–111)
Creatinine, Ser: 0.95 mg/dL (ref 0.61–1.24)
GFR calc Af Amer: >60 mL/min (ref >60)
GFR calc non Af Amer: >60 mL/min (ref >60)
Glucose, Bld: 105 mg/dL — ABNORMAL HIGH (ref 70–99)
Potassium: 3.3 mmol/L — ABNORMAL LOW (ref 3.5–5.1)
Sodium: 141 mmol/L (ref 135–145)

## 2019-02-08 LAB — CBC
HCT: 30.6 % — ABNORMAL LOW (ref 39.0–52.0)
Hemoglobin: 10.3 g/dL — ABNORMAL LOW (ref 13.0–17.0)
MCH: 31.9 pg (ref 26.0–34.0)
MCHC: 33.7 g/dL (ref 30.0–36.0)
MCV: 94.7 fL (ref 80.0–100.0)
Platelets: 783 10*3/uL — ABNORMAL HIGH (ref 150–400)
RBC: 3.23 MIL/uL — ABNORMAL LOW (ref 4.22–5.81)
RDW: 14 % (ref 11.5–15.5)
WBC: 16.6 10*3/uL — ABNORMAL HIGH (ref 4.0–10.5)
nRBC: 0 % (ref 0.0–0.2)

## 2019-02-08 MED ORDER — POTASSIUM CHLORIDE CRYS ER 20 MEQ PO TBCR
40.0000 meq | EXTENDED_RELEASE_TABLET | Freq: Two times a day (BID) | ORAL | Status: DC
  Administered 2019-02-08 – 2019-02-11 (×7): 40 meq via ORAL
  Filled 2019-02-08 (×7): qty 2
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
## 2019-02-09 NOTE — Progress Notes (Signed)
CSW met with patient to engage in an SBIRT assessment. CSW noted patient denied any alcohol use but was positive on admission. CSW noted patient scored a 6. CSW offered resources and AOD providers for post-hospital support and noted patient declined at this time.  Joey McKinney, LCSW, LCAS-A Clinical Social Worker II [phone] 

## 2019-02-10 LAB — CBC
HCT: 30.8 % — ABNORMAL LOW (ref 39.0–52.0)
Hemoglobin: 10 g/dL — ABNORMAL LOW (ref 13.0–17.0)
MCH: 31 pg (ref 26.0–34.0)
MCHC: 32.5 g/dL (ref 30.0–36.0)
MCV: 95.4 fL (ref 80.0–100.0)
Platelets: 825 10*3/uL — ABNORMAL HIGH (ref 150–400)
RBC: 3.23 MIL/uL — ABNORMAL LOW (ref 4.22–5.81)
RDW: 14.1 % (ref 11.5–15.5)
WBC: 13 10*3/uL — ABNORMAL HIGH (ref 4.0–10.5)
nRBC: 0 % (ref 0.0–0.2)

## 2019-02-10 MED ORDER — PANTOPRAZOLE SODIUM 40 MG PO TBEC: 40.0000 mg | DELAYED_RELEASE_TABLET | Freq: Every day | ORAL | 0 refills | Status: DC

## 2019-02-10 MED ORDER — ACETAMINOPHEN 325 MG PO TABS: 650.0000 mg | ORAL_TABLET | Freq: Four times a day (QID) | ORAL | Status: AC

## 2019-02-10 MED ORDER — SODIUM CHLORIDE 0.9% FLUSH: 5.0000 mL | Freq: Two times a day (BID) | INTRAVENOUS | 2 refills | Status: DC

## 2019-02-10 MED ORDER — OXYCODONE HCL 5 MG PO TABS: 5.0000 mg | ORAL_TABLET | Freq: Four times a day (QID) | ORAL | 0 refills | Status: DC | PRN

## 2019-02-10 MED ORDER — DOCUSATE SODIUM 100 MG PO CAPS: 100.0000 mg | ORAL_CAPSULE | Freq: Two times a day (BID) | ORAL | 0 refills | Status: DC

## 2019-02-10 MED FILL — oxyCODONE HCL 5 MG TABS: 5 | 7 days supply | Qty: 25 | Fill #0

## 2019-02-10 MED FILL — PANTOPRAZOLE SOD DR 40 MG T: 40 | 30 days supply | Qty: 30 | Fill #0

## 2019-02-10 NOTE — Progress Notes (Signed)
IR rounding note via telephone per new regulations. Spoke with Almira Coaster, RN.  Patient with history of LUQ subcapsular fluid collection (suspected biloma) s/p perihepatic drain placement 02/01/2019 by Dr. Miles Costain; s/p perihepatic drain exchange and placement of new perihepatic drain 02/07/2019 by Dr. Grace Isaac.  RN reports left drain site c/d/i with approximately 5 mL (since 0700 today) of clear yellow fluid with bloody debris in JP drain. RN reports right drain site c/d/i with approximately 5 mL (since 0700 today) of clear brown fluid with bloody debris in JP drain. RN reports both drains flush/aspirate without resistance.  Continue current drain management- continue with Qshift flushes/monitor of output. Plans per CCS- appreciate and agree with management. IR to follow.   Waylan Boga Louk, PA-C 02/10/2019, 10:24 AM
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
## 2019-02-10 NOTE — TOC Progression Note (Signed)
Transition of Care (TOC) - Progression Note  Donn Pierini RN,BSN Transitions of Care Cross Coverage 4NP - RN Case Manager [phone]   Patient Details  Name: John Briggs MRN: [id number] Date of Birth: [birth year]  Transition of Care [facility]) CM/SW Contact  Zenda Alpers, Lenn Sink, RN Phone Number: 02/10/2019, 3:29 PM  Clinical Narrative:    Pt stable for transition home, however has no insurance and will need charity care for [facility], noted CM notes over weekend that [facility] did not accept referral made on 5/15 for charity [facility]- reached out to [facility] to see if they could accept- per Tiffany they could not accept referral, call made to [facility] with Frances Furbish to see if they could take referral for charity [facility] needs. Also spoke with TOC who will plan to fill pt's meds prior to discharge using MATCH- pt placed in [facility] system and pain meds included in copays. Total cost for meds per TOC $6 under MATCH. Will await word from [facility] regarding [facility] services- Brooke- with Trauma updated.  1530- received call back from [facility] with Reyne Dumas has been accepted for [facility] under [facility] program. Start of Care can be on 5/20 for [facility] needs. Call made to [facility] with Trauma to let team know [facility] services have been secured for pt.    Expected Discharge Plan: Home w Home Health Services Barriers to Discharge: Inadequate or no insurance(Charity home care needs and securing HH needs)  Expected Discharge Plan and Services Expected Discharge Plan: Home w Home Health Services In-house Referral: Clinical Social Work, Artist, Radio producer Services: MATCH Program, Medication Assistance Post Acute Care Choice: Home Health Living arrangements for the past 2 months: Single Family Home                 DME Arranged: Ostomy supplies         HH Arranged: RN, Social Work [employer] Agency: [facility] Date HH Agency Contacted: 02/10/19 Time HH Agency Contacted: 1137 Representative spoke with at  [facility] Agency: Lorenza Chick   Social Determinants of Health (SDOH) Interventions    Readmission Risk Interventions No flowsheet data found.
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
## 2019-02-11 ENCOUNTER — Encounter (HOSPITAL_COMMUNITY): Payer: Self-pay | Admitting: *Deleted

## 2019-02-11 ENCOUNTER — Other Ambulatory Visit: Payer: Self-pay

## 2019-02-11 NOTE — Progress Notes (Signed)
IR rounding note via telephone per new regulations. Spoke with Marchelle Folks, RN.  Patient with history of LUQ subcapsular fluid collection (suspected biloma) s/p perihepatic drain placement 02/01/2019 by Dr. Miles Costain; s/p perihepatic drain exchange and placement of new perihepatic drain 02/07/2019 by Dr. Grace Isaac.  RN reports left drain site c/d/i with approximately 20 cc of serosanguineous fluid in JP drain. RN reports right drain site c/d/i with approximately 10 cc of serosanguineous in JP drain. RN reports both drains flush/aspirate without resistance.  Continue current drain management- continue with Qshift flushes/monitor of output; flush with 10 cc NS to each drain daily once discharged- please discharge patient with prescription for flushes. Plan to follow-up at drain clinic in 10-14 days. Appreciate and agree with [facility] management. Please call IR with questions/concerns.   Waylan Boga Louk, PA-C 02/11/2019, 11:37 AM
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
## 2019-02-11 NOTE — Plan of Care (Signed)
  Problem: Activity: Goal: Ability to tolerate increased activity will improve Outcome: Completed/Met   Problem: Respiratory: Goal: Ability to maintain a clear airway and adequate ventilation will improve Outcome: Completed/Met   Problem: Role Relationship: Goal: Method of communication will improve Outcome: Completed/Met   Problem: Education: Goal: Knowledge of General Education information will improve Description Including pain rating scale, medication(s)/side effects and non-pharmacologic comfort measures Outcome: Completed/Met   Problem: Health Behavior/Discharge Planning: Goal: Ability to manage health-related needs will improve Outcome: Completed/Met   Problem: Clinical Measurements: Goal: Ability to maintain clinical measurements within normal limits will improve Outcome: Completed/Met Goal: Will remain free from infection Outcome: Completed/Met Goal: Diagnostic test results will improve Outcome: Completed/Met Goal: Respiratory complications will improve Outcome: Completed/Met Goal: Cardiovascular complication will be avoided Outcome: Completed/Met   Problem: Activity: Goal: Risk for activity intolerance will decrease Outcome: Completed/Met   Problem: Nutrition: Goal: Adequate nutrition will be maintained Outcome: Completed/Met   Problem: Coping: Goal: Level of anxiety will decrease Outcome: Completed/Met   Problem: Elimination: Goal: Will not experience complications related to bowel motility Outcome: Completed/Met Goal: Will not experience complications related to urinary retention Outcome: Completed/Met   Problem: Pain Managment: Goal: General experience of comfort will improve Outcome: Completed/Met   Problem: Safety: Goal: Ability to remain free from injury will improve Outcome: Completed/Met   Problem: Skin Integrity: Goal: Risk for impaired skin integrity will decrease Outcome: Completed/Met
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
## 2019-02-11 NOTE — Progress Notes (Signed)
Pt states he changed his colostomy and wound dressing this AM with night shift nurse. Pt has no questions regarding that. Pt has demonstrated how to empty drains and colostomy multiple times to this RN and pt has no questions. Pt also demonstrated how to flush both drains appropriately to this RN and aware of how to change dressing also. Pt denies having questions about any of the above, pt has medications in room for transitions of care pharmacy, pt given discharge instructions with understanding. Pt has no questions at this time. Awaiting sig other to pick up.
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
## 2019-02-12 ENCOUNTER — Other Ambulatory Visit: Payer: Self-pay | Admitting: Surgery

## 2019-02-12 ENCOUNTER — Encounter (HOSPITAL_COMMUNITY): Payer: Self-pay | Admitting: Emergency Medicine

## 2019-02-12 DIAGNOSIS — K651 Peritoneal abscess: Secondary | ICD-10-CM

## 2019-02-12 LAB — AEROBIC/ANAEROBIC CULTURE W GRAM STAIN (SURGICAL/DEEP WOUND)

## 2019-02-20 ENCOUNTER — Inpatient Hospital Stay: Payer: Self-pay | Admitting: Family Medicine

## 2019-02-20 ENCOUNTER — Ambulatory Visit
Admission: RE | Admit: 2019-02-20 | Discharge: 2019-02-20 | Disposition: A | Discharge status: Home / Self Care | Payer: Self-pay | Source: Ambulatory Visit | Attending: Student | Admitting: Student

## 2019-02-20 ENCOUNTER — Other Ambulatory Visit: Payer: Self-pay

## 2019-02-20 ENCOUNTER — Ambulatory Visit
Admission: RE | Admit: 2019-02-20 | Discharge: 2019-02-20 | Disposition: A | Discharge status: Home / Self Care | Payer: Self-pay | Source: Ambulatory Visit | Attending: Surgery | Admitting: Surgery

## 2019-02-20 DIAGNOSIS — K651 Peritoneal abscess: Secondary | ICD-10-CM

## 2019-02-20 HISTORY — PX: IR RADIOLOGIST EVAL & MGMT: IMG5224
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
## 2019-02-20 MED ORDER — IOPAMIDOL (ISOVUE-300) INJECTION 61%
100.0000 mL | Freq: Once | INTRAVENOUS | Status: AC | PRN
  Administered 2019-02-20: 100 mL via INTRAVENOUS
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
## 2019-06-09 ENCOUNTER — Ambulatory Visit: Payer: Self-pay | Admitting: Gastroenterology

## 2019-06-24 ENCOUNTER — Ambulatory Visit: Payer: Self-pay | Admitting: Gastroenterology

## 2019-07-09 ENCOUNTER — Encounter: Payer: Self-pay | Admitting: Gastroenterology

## 2019-07-09 ENCOUNTER — Ambulatory Visit (INDEPENDENT_AMBULATORY_CARE_PROVIDER_SITE_OTHER): Payer: Self-pay | Admitting: Gastroenterology

## 2019-07-09 ENCOUNTER — Other Ambulatory Visit: Payer: Self-pay

## 2019-07-09 VITALS — BP 124/70 | HR 68 | Temp 98.4°F | Ht 68.0 in | Wt 124.0 lb

## 2019-07-09 DIAGNOSIS — W3400XA Accidental discharge from unspecified firearms or gun, initial encounter: Secondary | ICD-10-CM

## 2019-07-09 DIAGNOSIS — Z933 Colostomy status: Secondary | ICD-10-CM | POA: Insufficient documentation

## 2019-07-09 NOTE — Progress Notes (Signed)
Reviewed and agree with management plan.  Alvan Culpepper T. Lyndle Pang, MD FACG [facility]  
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
## 2019-07-09 NOTE — Patient Instructions (Signed)
We will contact Dr. Dema Severin to see how he wants to proceed.  Mostly likely you will have a colonoscopy in the spring.

## 2019-10-30 ENCOUNTER — Other Ambulatory Visit: Payer: Self-pay | Admitting: Surgery

## 2019-10-30 ENCOUNTER — Other Ambulatory Visit (HOSPITAL_COMMUNITY): Payer: Self-pay | Admitting: Surgery

## 2019-10-30 DIAGNOSIS — Z933 Colostomy status: Secondary | ICD-10-CM

## 2019-11-06 ENCOUNTER — Encounter (HOSPITAL_COMMUNITY): Payer: Self-pay

## 2019-11-06 ENCOUNTER — Ambulatory Visit (HOSPITAL_COMMUNITY): Admission: RE | Admit: 2019-11-06 | Payer: Self-pay | Source: Ambulatory Visit

## 2019-11-26 ENCOUNTER — Telehealth: Payer: Self-pay | Admitting: Gastroenterology

## 2019-11-26 ENCOUNTER — Ambulatory Visit (HOSPITAL_COMMUNITY): Payer: Medicaid Other

## 2019-11-26 ENCOUNTER — Encounter (HOSPITAL_COMMUNITY): Payer: Self-pay

## 2019-11-26 NOTE — Telephone Encounter (Signed)
Please see Dr Anselm Jungling message

## 2019-11-26 NOTE — Telephone Encounter (Signed)
I think ok to schedule direct with Dr. Russella Dar, but let's make sure he agrees.

## 2019-11-26 NOTE — Telephone Encounter (Signed)
OK for direct schedule colonoscopy in [facility].  Office evaluation by JZ in October.

## 2019-11-26 NOTE — Telephone Encounter (Signed)
John Briggs is it ok to schedule the pt with Dr Russella Dar or should he have an appt with Dr Russella Dar to discuss? Dr Russella Dar can you please review? Thank you both

## 2019-12-04 ENCOUNTER — Ambulatory Visit (HOSPITAL_COMMUNITY)
Admission: RE | Admit: 2019-12-04 | Discharge: 2019-12-04 | Disposition: A | Discharge status: Home / Self Care | Payer: Medicaid Other | Source: Ambulatory Visit | Attending: Surgery | Admitting: Surgery

## 2019-12-04 ENCOUNTER — Other Ambulatory Visit: Payer: Self-pay

## 2019-12-04 DIAGNOSIS — Z933 Colostomy status: Secondary | ICD-10-CM | POA: Insufficient documentation
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
## 2019-12-04 MED ORDER — IOHEXOL 300 MG/ML  SOLN
300.0000 mL | Freq: Once | INTRAMUSCULAR | Status: AC | PRN
  Administered 2019-12-04: 300 mL

## 2019-12-09 ENCOUNTER — Encounter: Payer: Self-pay | Admitting: Gastroenterology

## 2020-01-08 ENCOUNTER — Other Ambulatory Visit: Payer: Self-pay

## 2020-01-08 ENCOUNTER — Ambulatory Visit (AMBULATORY_SURGERY_CENTER): Payer: Self-pay | Admitting: *Deleted

## 2020-01-08 VITALS — Temp 97.0°F | Ht 68.0 in | Wt 130.0 lb

## 2020-01-08 DIAGNOSIS — Z433 Encounter for attention to colostomy: Secondary | ICD-10-CM

## 2020-01-08 DIAGNOSIS — Z939 Artificial opening status, unspecified: Secondary | ICD-10-CM

## 2020-01-08 DIAGNOSIS — Z1211 Encounter for screening for malignant neoplasm of colon: Secondary | ICD-10-CM

## 2020-01-08 DIAGNOSIS — Z01818 Encounter for other preprocedural examination: Secondary | ICD-10-CM

## 2020-01-08 NOTE — Progress Notes (Signed)

## 2020-01-08 NOTE — Progress Notes (Signed)
sutab sample given, pt has to pay for meds out of pocket

## 2020-01-19 ENCOUNTER — Other Ambulatory Visit (HOSPITAL_COMMUNITY)
Admission: RE | Admit: 2020-01-19 | Discharge: 2020-01-19 | Disposition: A | Discharge status: Home / Self Care | Payer: Medicaid Other | Source: Ambulatory Visit | Attending: Gastroenterology | Admitting: Gastroenterology

## 2020-01-19 DIAGNOSIS — Z01812 Encounter for preprocedural laboratory examination: Secondary | ICD-10-CM | POA: Diagnosis present

## 2020-01-19 DIAGNOSIS — Z20822 Contact with and (suspected) exposure to covid-19: Secondary | ICD-10-CM | POA: Insufficient documentation

## 2020-01-19 LAB — SARS CORONAVIRUS 2 (TAT 6-24 HRS): SARS Coronavirus 2: NEGATIVE

## 2020-01-20 ENCOUNTER — Telehealth: Payer: Self-pay | Admitting: Gastroenterology

## 2020-01-20 NOTE — Telephone Encounter (Signed)
Patient called with questions about the procedure and prep please advise

## 2020-01-22 ENCOUNTER — Encounter: Payer: Self-pay | Admitting: Gastroenterology

## 2020-01-22 ENCOUNTER — Ambulatory Visit (AMBULATORY_SURGERY_CENTER): Payer: Self-pay | Admitting: Gastroenterology

## 2020-01-22 ENCOUNTER — Other Ambulatory Visit: Payer: Self-pay

## 2020-01-22 VITALS — BP 141/91 | HR 70 | Temp 97.1°F | Resp 21 | Ht 68.0 in | Wt 135.0 lb

## 2020-01-22 DIAGNOSIS — Z01818 Encounter for other preprocedural examination: Secondary | ICD-10-CM

## 2020-01-22 DIAGNOSIS — K640 First degree hemorrhoids: Secondary | ICD-10-CM

## 2020-01-22 DIAGNOSIS — Z433 Encounter for attention to colostomy: Secondary | ICD-10-CM

## 2020-01-22 MED ORDER — SODIUM CHLORIDE 0.9 % IV SOLN: 500.0000 mL | Freq: Once | INTRAVENOUS | Status: DC

## 2020-01-22 NOTE — Progress Notes (Signed)
Vitals-KA Temp-LC  Pt's states no medical or surgical changes since previsit or office visit. 

## 2020-01-22 NOTE — Progress Notes (Signed)
PT taken to PACU. Monitors in place. VSS. Report given to RN. 

## 2020-01-22 NOTE — Patient Instructions (Signed)
Information on hemorrhoids given to you today.  Continue previous diet and medications.  Return to Dr. Marin Olp for ongoing surgical care.  YOU HAD AN ENDOSCOPIC PROCEDURE TODAY AT THE [facility]:   Refer to the procedure report that was given to you for any specific questions about what was found during the examination.  If the procedure report does not answer your questions, please call your gastroenterologist to clarify.  If you requested that your care partner not be given the details of your procedure findings, then the procedure report has been included in a sealed envelope for you to review at your convenience later.  YOU SHOULD EXPECT: Some feelings of bloating in the abdomen. Passage of more gas than usual.  Walking can help get rid of the air that was put into your GI tract during the procedure and reduce the bloating. If you had a lower endoscopy (such as a colonoscopy or flexible sigmoidoscopy) you may notice spotting of blood in your stool or on the toilet paper. If you underwent a bowel prep for your procedure, you may not have a normal bowel movement for a few days.  Please Note:  You might notice some irritation and congestion in your nose or some drainage.  This is from the oxygen used during your procedure.  There is no need for concern and it should clear up in a day or so.  SYMPTOMS TO REPORT IMMEDIATELY:   Following lower endoscopy (colonoscopy or flexible sigmoidoscopy):  Excessive amounts of blood in the stool  Significant tenderness or worsening of abdominal pains  Swelling of the abdomen that is new, acute  Fever of 100F or higher   For urgent or emergent issues, a gastroenterologist can be reached at any hour by calling [phone]. Do not use MyChart messaging for urgent concerns.    DIET:  We do recommend a small meal at first, but then you may proceed to your regular diet.  Drink plenty of fluids but you should avoid alcoholic  beverages for 24 hours.  ACTIVITY:  You should plan to take it easy for the rest of today and you should NOT DRIVE or use heavy machinery until tomorrow (because of the sedation medicines used during the test).    FOLLOW UP: Our staff will call the number listed on your records 48-72 hours following your procedure to check on you and address any questions or concerns that you may have regarding the information given to you following your procedure. If we do not reach you, we will leave a message.  We will attempt to reach you two times.  During this call, we will ask if you have developed any symptoms of COVID 19. If you develop any symptoms (ie: fever, flu-like symptoms, shortness of breath, cough etc.) before then, please call [phone].  If you test positive for Covid 19 in the 2 weeks post procedure, please call and report this information to [place].    If any biopsies were taken you will be contacted by phone or by letter within the next 1-3 weeks.  Please call us at [phone] if you have not heard about the biopsies in 3 weeks.    SIGNATURES/CONFIDENTIALITY: You and/or your care partner have signed paperwork which will be entered into your electronic medical record.  These signatures attest to the fact that that the information above on your After Visit Summary has been reviewed and is understood.  Full responsibility of the confidentiality of this discharge information  lies with you and/or your care-partner.

## 2020-01-22 NOTE — Op Note (Signed)
[facility] Patient Name: Zarif Rathje Procedure Date: 01/22/2020 10:39 AM MRN: [id number] Endoscopist: Meryl Dare , MD Age: 34 Referring MD:  Date of Birth: Mar 31, 1986 Gender: Male Account #: [id number] Procedure:                Colonoscopy Indications:              Assess colon prior to colostomy take down Medicines:                Monitored Anesthesia Care Procedure:                Pre-Anesthesia Assessment:                           - Prior to the procedure, a History and Physical                            was performed, and patient medications and                            allergies were reviewed. The patient's tolerance of                            previous anesthesia was also reviewed. The risks                            and benefits of the procedure and the sedation                            options and risks were discussed with the patient.                            All questions were answered, and informed consent                            was obtained. Prior Anticoagulants: The patient has                            taken no previous anticoagulant or antiplatelet                            agents. ASA Grade Assessment: II - A patient with                            mild systemic disease. After reviewing the risks                            and benefits, the patient was deemed in                            satisfactory condition to undergo the procedure.                           After obtaining informed consent, the colonoscope  was passed under direct vision. Throughout the                            procedure, the patient's blood pressure, pulse, and                            oxygen saturations were monitored continuously. The                            Colonoscope was introduced through the transverse                            colostomy and advanced to the the cecum, identified                            by appendiceal  orifice and ileocecal valve. The                            Colonoscope was then introduced through the anus                            and advanced to the end of the left colon. The                            colonoscopy was performed without difficulty. The                            patient tolerated the procedure well. The quality                            of the bowel preparation was good. Scope In: Scope Out: Findings:                 The perianal and digital rectal examinations were                            normal.                           Patchy areas of mildly erythematous mucosa was                            found in the rectum and in the sigmoid colon c/w                            diverted status.                           There was evidence of a prior surgical anastomosis,                            Hartmann's pouch in the left colon. End of lumen at  35 cm. This was characterized by healthy appearing                            mucosa. The anastomosis was not traversed.                           Internal hemorrhoids were found during                            retroflexion. The hemorrhoids were small and Grade                            I (internal hemorrhoids that do not prolapse).                           There was evidence of a patent end colostomy in the                            transverse colon. This was characterized by healthy                            appearing mucosa.                           The exam was otherwise normal from the transverse                            colostomy to the cecum. Complications:            No immediate complications. Estimated Blood Loss:     Estimated blood loss: none. Impression:               - Patchy areas of erythematous mucosa in the rectum                            and in the sigmoid colon c/w diverted status.                           - Surgical anastomosis, Hartmann's pouch,                             characterized by healthy appearing mucosa.                           - Internal hemorrhoids.                           - Patent end transverse colostomy with healthy                            appearing mucosa in the transverse colon to the                            cecum.                           -  No specimens collected. Recommendation:           - Patient has a contact number available for                            emergencies. The signs and symptoms of potential                            delayed complications were discussed with the                            patient. Return to normal activities tomorrow.                            Written discharge instructions were provided to the                            patient.                           - Resume previous diet.                           - Continue present medications.                           - Return to Dr. Nadeen Landau for ongoing                            surgical care. Ladene Artist, MD 01/22/2020 11:13:47 AM This report has been signed electronically.

## 2020-01-26 ENCOUNTER — Telehealth: Payer: Self-pay

## 2020-01-26 NOTE — Telephone Encounter (Signed)
  Follow up Call-  Call back number 01/22/2020  Post procedure Call Back phone  # [phone]  Permission to leave phone message Yes  Some recent data might be hidden     Patient questions:  Do you have a fever, pain , or abdominal swelling? No. Pain Score  0 *  Have you tolerated food without any problems? Yes.    Have you been able to return to your normal activities? Yes.    Do you have any questions about your discharge instructions: Diet   No. Medications  No. Follow up visit  No.  Do you have questions or concerns about your Care? No.  Actions: * If pain score is 4 or above: No action needed, pain <4.  1. Have you developed a fever since your procedure? no  2.   Have you had an respiratory symptoms (SOB or cough) since your procedure? no  3.   Have you tested positive for COVID 19 since your procedure no  4.   Have you had any family members/close contacts diagnosed with the COVID 19 since your procedure?  no   If yes to any of these questions please route to Laverna Peace, RN and Charlett Lango, RN

## 2020-01-26 NOTE — Telephone Encounter (Signed)
Patient returned your call and left message with answering service. Please call patient one more time.

## 2020-01-26 NOTE — Telephone Encounter (Signed)
Attempted to reach patient for post-procedure f/u call. No answer. Unable to leave message as voicemail not set up yet. Staff will attempt to reach him later today.

## 2020-02-02 ENCOUNTER — Encounter: Payer: Medicaid Other | Admitting: Gastroenterology

## 2021-03-01 ENCOUNTER — Ambulatory Visit (INDEPENDENT_AMBULATORY_CARE_PROVIDER_SITE_OTHER): Payer: Self-pay

## 2021-03-01 ENCOUNTER — Encounter (HOSPITAL_COMMUNITY): Payer: Self-pay

## 2021-03-01 ENCOUNTER — Ambulatory Visit (HOSPITAL_COMMUNITY)
Admission: EM | Admit: 2021-03-01 | Discharge: 2021-03-01 | Disposition: A | Discharge status: Home / Self Care | Payer: Self-pay | Attending: Emergency Medicine | Admitting: Emergency Medicine

## 2021-03-01 ENCOUNTER — Other Ambulatory Visit: Payer: Self-pay

## 2021-03-01 DIAGNOSIS — S42034A Nondisplaced fracture of lateral end of right clavicle, initial encounter for closed fracture: Secondary | ICD-10-CM

## 2021-03-01 DIAGNOSIS — M25511 Pain in right shoulder: Secondary | ICD-10-CM

## 2021-03-01 DIAGNOSIS — Y9361 Activity, american tackle football: Secondary | ICD-10-CM

## 2021-03-01 DIAGNOSIS — W19XXXA Unspecified fall, initial encounter: Secondary | ICD-10-CM

## 2021-03-01 DIAGNOSIS — W500XXA Accidental hit or strike by another person, initial encounter: Secondary | ICD-10-CM

## 2021-03-01 IMAGING — DX DG SHOULDER 2+V*R*
4 series · 4 of 4 positions shown · non-contrast
Comparison: None.

CLINICAL DATA: Pain and limitation of motion after football tackle

EXAM:
RIGHT SHOULDER - 2+ VIEW

[shoulder ap]
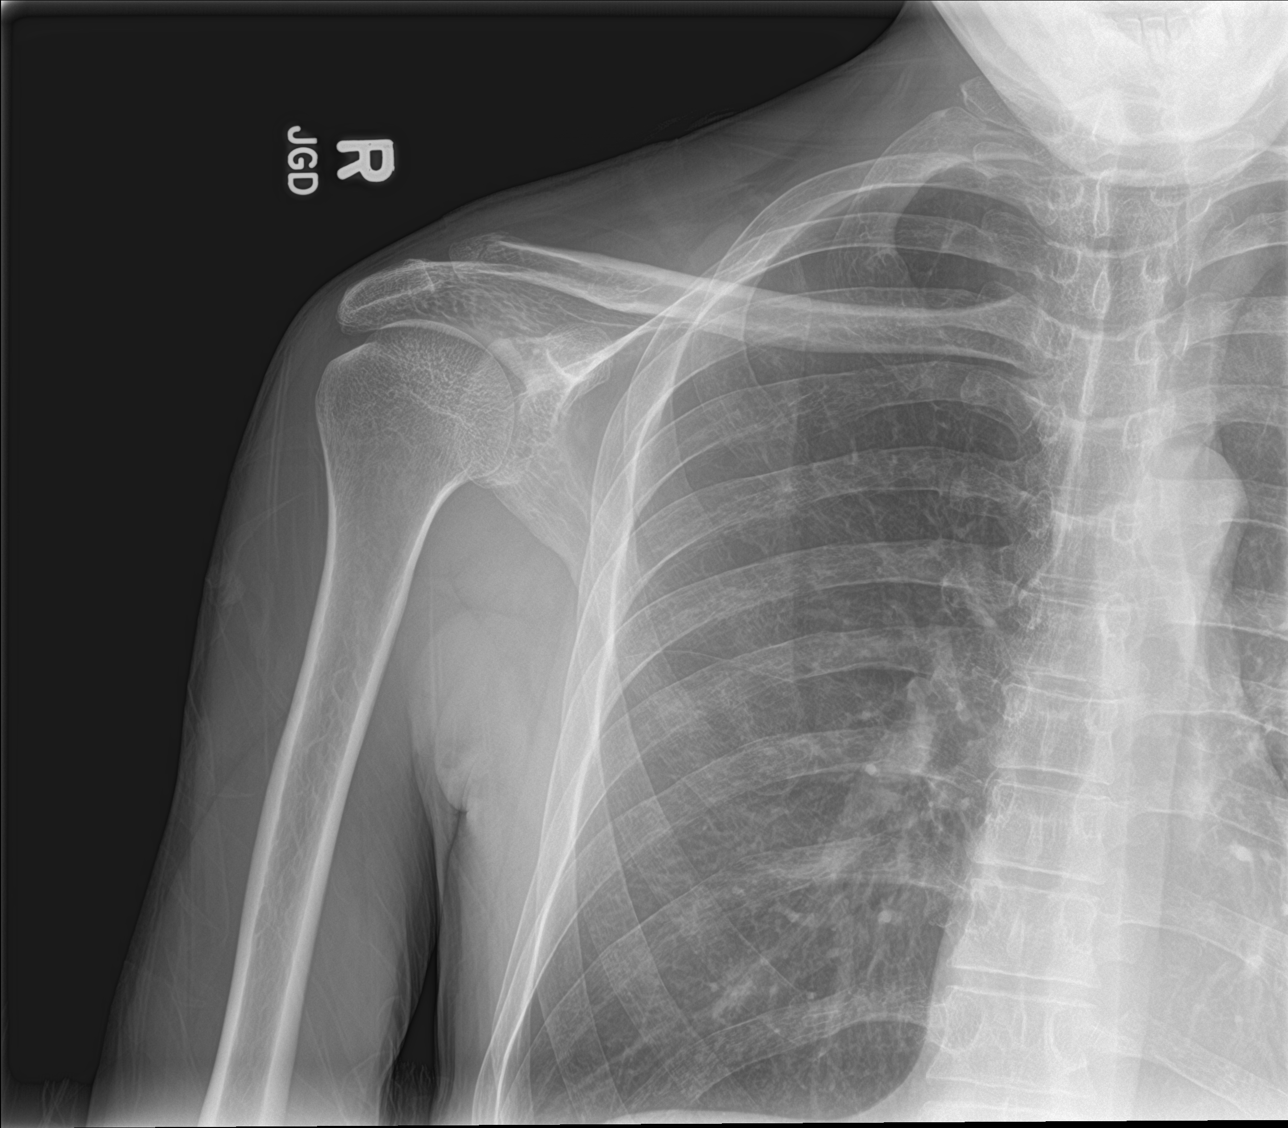

[shoulder grashey]
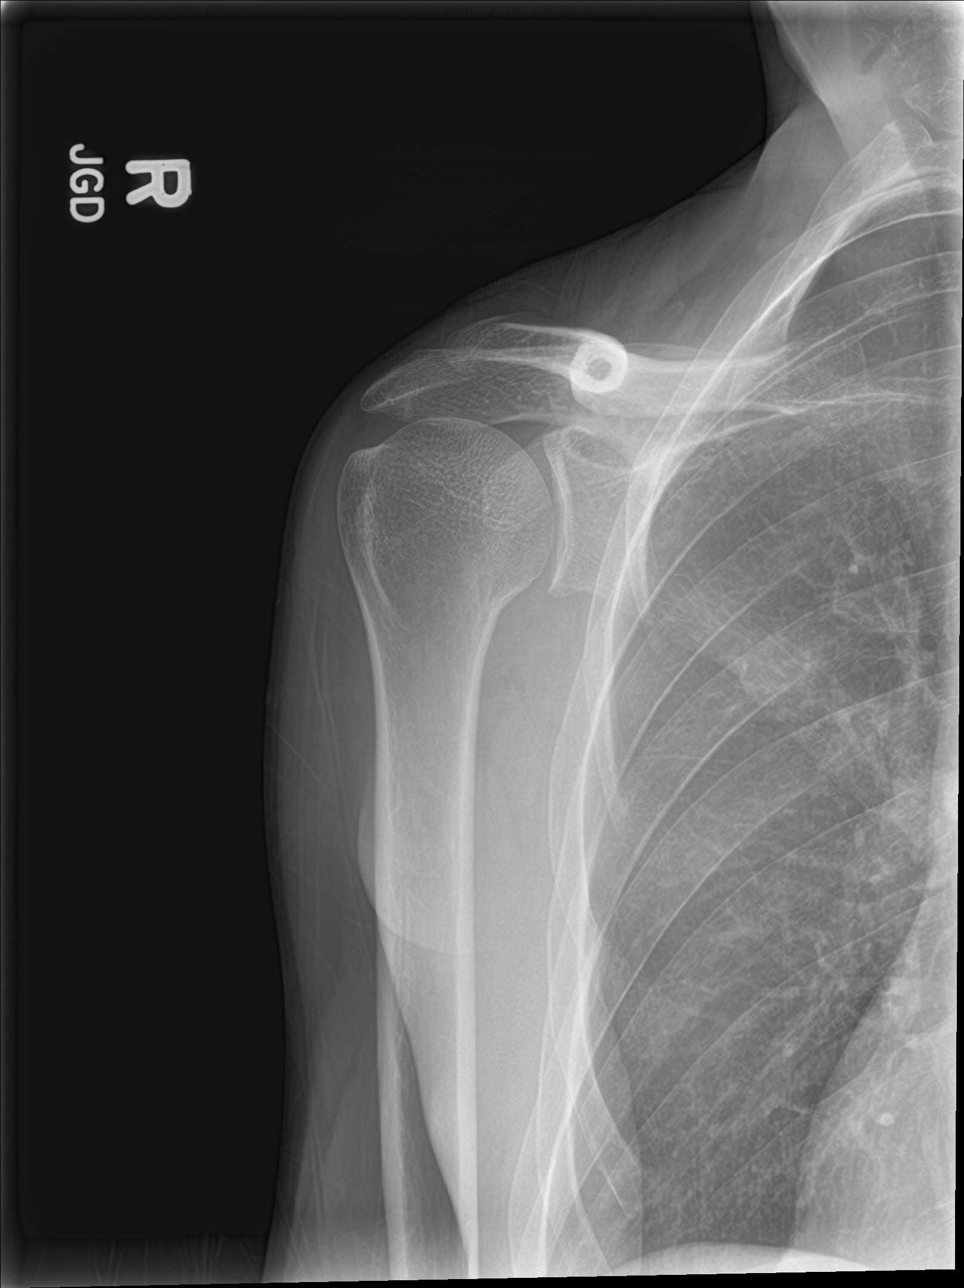

[shoulder y-view]
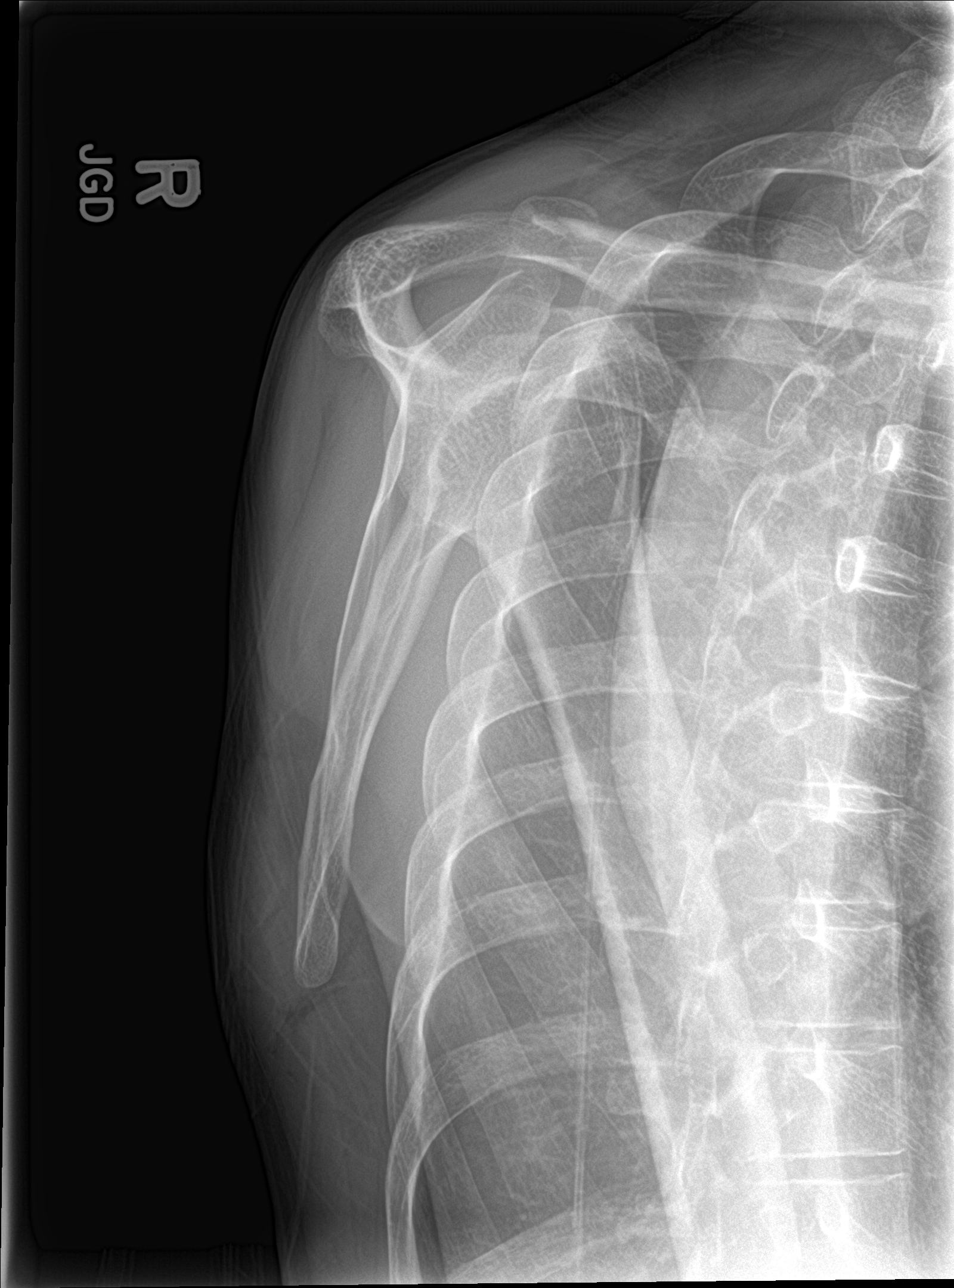

[shoulder axial]
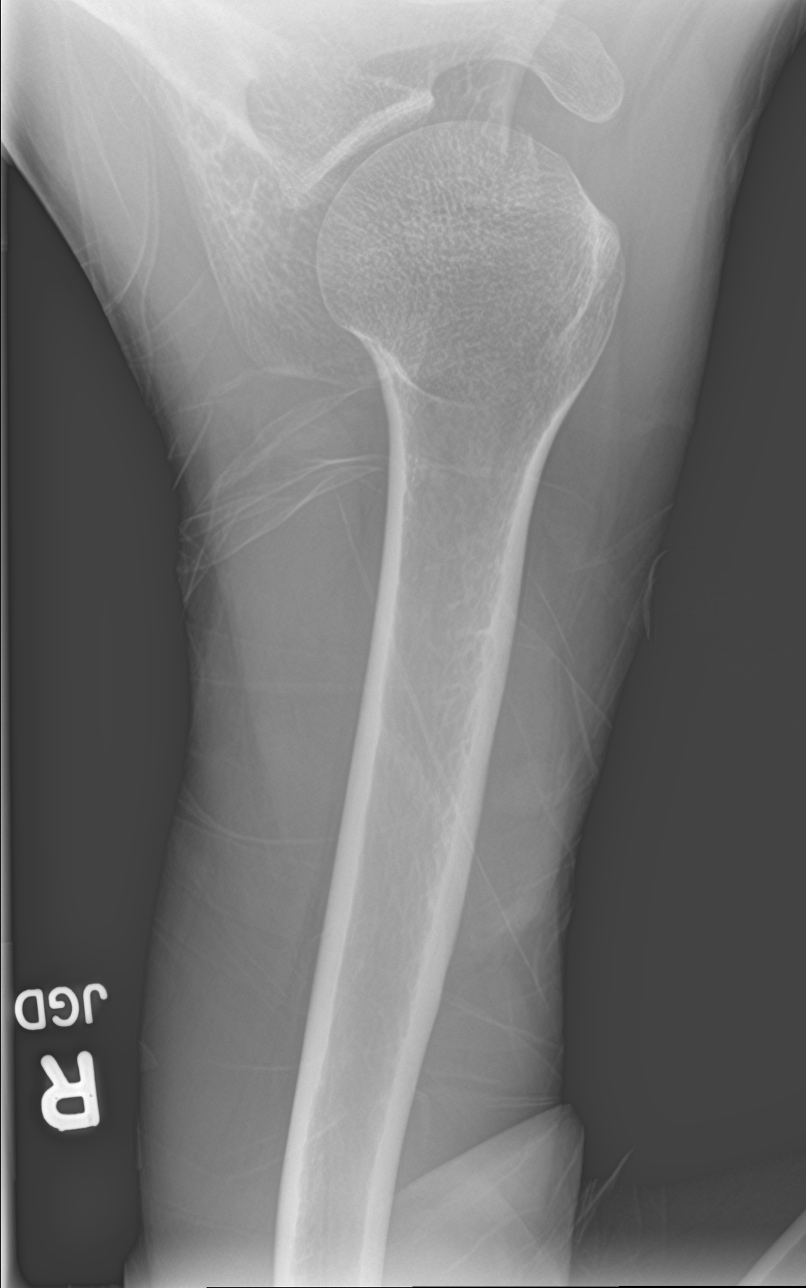

[4 of 4 positions shown; findings below may reference images not displayed]

FINDINGS: Frontal, oblique, Y scapular, and axillary images were obtained.
There is a lucency concerning for nondisplaced fracture in the
lateral aspect of the right clavicle. No other apparent fracture. No
dislocation. Joint spaces appear unremarkable. No erosive change.
Visualized right lung clear.
IMPRESSION: Subtle lucency in the lateral right clavicle, a likely nondisplaced
fracture in this area. No other evident fracture. No dislocation. No
appreciable underlying arthropathic change.

These results will be called to the ordering clinician or
representative by the Radiologist Assistant, and communication
documented in the PACS or [REDACTED].

## 2021-03-01 NOTE — Discharge Instructions (Addendum)
Follow up with orthopedics as soon as possible.   Wear the shoulder immobilizer and rest.  Wiggle your fingers regularly throughout the day.    You can take Ibuprofen and/or Tylenol as needed for pain.   Use ice for 10-15 minutes every 4-6 hours as needed for comfort.    Do not lift your arm.  Do not pick up anything over 10 lbs.
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
## 2022-10-26 DIAGNOSIS — R69 Illness, unspecified: Secondary | ICD-10-CM | POA: Diagnosis not present

## 2022-10-30 DIAGNOSIS — R69 Illness, unspecified: Secondary | ICD-10-CM | POA: Diagnosis not present

## 2022-10-31 DIAGNOSIS — R69 Illness, unspecified: Secondary | ICD-10-CM | POA: Diagnosis not present

## 2022-11-02 DIAGNOSIS — R69 Illness, unspecified: Secondary | ICD-10-CM | POA: Diagnosis not present

## 2022-11-07 DIAGNOSIS — R69 Illness, unspecified: Secondary | ICD-10-CM | POA: Diagnosis not present

## 2022-11-09 DIAGNOSIS — R69 Illness, unspecified: Secondary | ICD-10-CM | POA: Diagnosis not present

## 2022-11-14 DIAGNOSIS — R69 Illness, unspecified: Secondary | ICD-10-CM | POA: Diagnosis not present

## 2022-11-15 DIAGNOSIS — R69 Illness, unspecified: Secondary | ICD-10-CM | POA: Diagnosis not present

## 2022-11-21 DIAGNOSIS — F1020 Alcohol dependence, uncomplicated: Secondary | ICD-10-CM | POA: Diagnosis not present

## 2022-11-23 DIAGNOSIS — F1020 Alcohol dependence, uncomplicated: Secondary | ICD-10-CM | POA: Diagnosis not present

## 2024-04-18 ENCOUNTER — Telehealth: Payer: Self-pay

## 2024-04-18 DIAGNOSIS — Z7189 Other specified counseling: Secondary | ICD-10-CM

## 2024-04-18 NOTE — Progress Notes (Signed)
 Complex Care Management Note  Care Guide Note 04/18/2024 Name: Salvator Seppala MRN: [id number] DOB: [birth year]  Zacheriah Stumpe is a 38 y.o. year old male who sees Patient, No Pcp Per for primary care. I reached out to Ubaldo Hence by phone today to offer complex care management services.  Mr. Chesnut was given information about Complex Care Management services today including:   The Complex Care Management services include support from the care team which includes your Nurse Care Manager, Clinical Social Worker, or Pharmacist.  The Complex Care Management team is here to help remove barriers to the health concerns and goals most important to you. Complex Care Management services are voluntary, and the patient may decline or stop services at any time by request to their care team member.   Complex Care Management Consent Status: Patient did not agree to participate in complex care management services at this time.  Follow up plan:    Encounter Outcome:  Patient no longer has medicaid   Jeoffrey Buffalo , RMA     [facility]  [facility] Guide  Direct Dial: [phone]  Website: [email or web address]
